# Patient Record
Sex: Male | Born: 1980 | Race: Black or African American | Hispanic: No | Marital: Single | Smoking: Never smoker
Health system: Southern US, Community
[De-identification: ages and names within clinical notes are randomized; demographics above are authoritative.]

## PROBLEM LIST (undated history)

## (undated) DIAGNOSIS — E119 Type 2 diabetes mellitus without complications: Secondary | ICD-10-CM

---

## 2019-07-24 DIAGNOSIS — Z419 Encounter for procedure for purposes other than remedying health state, unspecified: Secondary | ICD-10-CM | POA: Diagnosis not present

## 2019-08-24 DIAGNOSIS — Z419 Encounter for procedure for purposes other than remedying health state, unspecified: Secondary | ICD-10-CM | POA: Diagnosis not present

## 2019-09-24 DIAGNOSIS — Z419 Encounter for procedure for purposes other than remedying health state, unspecified: Secondary | ICD-10-CM | POA: Diagnosis not present

## 2019-10-02 DIAGNOSIS — E1065 Type 1 diabetes mellitus with hyperglycemia: Secondary | ICD-10-CM | POA: Diagnosis not present

## 2019-10-24 DIAGNOSIS — Z419 Encounter for procedure for purposes other than remedying health state, unspecified: Secondary | ICD-10-CM | POA: Diagnosis not present

## 2019-11-24 DIAGNOSIS — Z419 Encounter for procedure for purposes other than remedying health state, unspecified: Secondary | ICD-10-CM | POA: Diagnosis not present

## 2019-12-24 DIAGNOSIS — Z419 Encounter for procedure for purposes other than remedying health state, unspecified: Secondary | ICD-10-CM | POA: Diagnosis not present

## 2020-01-24 DIAGNOSIS — Z419 Encounter for procedure for purposes other than remedying health state, unspecified: Secondary | ICD-10-CM | POA: Diagnosis not present

## 2020-02-16 DIAGNOSIS — K3184 Gastroparesis: Secondary | ICD-10-CM | POA: Diagnosis present

## 2020-02-24 DIAGNOSIS — Z419 Encounter for procedure for purposes other than remedying health state, unspecified: Secondary | ICD-10-CM | POA: Diagnosis not present

## 2020-03-12 DIAGNOSIS — E11649 Type 2 diabetes mellitus with hypoglycemia without coma: Secondary | ICD-10-CM | POA: Diagnosis not present

## 2020-03-23 DIAGNOSIS — Z419 Encounter for procedure for purposes other than remedying health state, unspecified: Secondary | ICD-10-CM | POA: Diagnosis not present

## 2020-04-15 DIAGNOSIS — E11649 Type 2 diabetes mellitus with hypoglycemia without coma: Secondary | ICD-10-CM | POA: Diagnosis not present

## 2020-04-15 DIAGNOSIS — D631 Anemia in chronic kidney disease: Secondary | ICD-10-CM | POA: Diagnosis not present

## 2020-04-15 DIAGNOSIS — N189 Chronic kidney disease, unspecified: Secondary | ICD-10-CM | POA: Diagnosis not present

## 2020-04-15 DIAGNOSIS — E1122 Type 2 diabetes mellitus with diabetic chronic kidney disease: Secondary | ICD-10-CM | POA: Diagnosis not present

## 2020-04-23 DIAGNOSIS — Z419 Encounter for procedure for purposes other than remedying health state, unspecified: Secondary | ICD-10-CM | POA: Diagnosis not present

## 2020-05-23 DIAGNOSIS — Z419 Encounter for procedure for purposes other than remedying health state, unspecified: Secondary | ICD-10-CM | POA: Diagnosis not present

## 2020-05-27 HISTORY — PX: COCHLEAR IMPLANT: SHX184

## 2020-06-23 DIAGNOSIS — Z419 Encounter for procedure for purposes other than remedying health state, unspecified: Secondary | ICD-10-CM | POA: Diagnosis not present

## 2020-07-23 DIAGNOSIS — Z419 Encounter for procedure for purposes other than remedying health state, unspecified: Secondary | ICD-10-CM | POA: Diagnosis not present

## 2020-08-23 DIAGNOSIS — Z419 Encounter for procedure for purposes other than remedying health state, unspecified: Secondary | ICD-10-CM | POA: Diagnosis not present

## 2020-09-10 DIAGNOSIS — Z6820 Body mass index (BMI) 20.0-20.9, adult: Secondary | ICD-10-CM | POA: Diagnosis not present

## 2020-09-10 DIAGNOSIS — H903 Sensorineural hearing loss, bilateral: Secondary | ICD-10-CM | POA: Diagnosis not present

## 2020-09-23 DIAGNOSIS — Z419 Encounter for procedure for purposes other than remedying health state, unspecified: Secondary | ICD-10-CM | POA: Diagnosis not present

## 2020-09-23 DIAGNOSIS — H903 Sensorineural hearing loss, bilateral: Secondary | ICD-10-CM | POA: Diagnosis not present

## 2020-09-23 DIAGNOSIS — Z9621 Cochlear implant status: Secondary | ICD-10-CM | POA: Diagnosis not present

## 2020-10-21 DIAGNOSIS — R6889 Other general symptoms and signs: Secondary | ICD-10-CM | POA: Diagnosis not present

## 2020-10-21 DIAGNOSIS — R404 Transient alteration of awareness: Secondary | ICD-10-CM | POA: Diagnosis not present

## 2020-10-21 DIAGNOSIS — R739 Hyperglycemia, unspecified: Secondary | ICD-10-CM | POA: Diagnosis not present

## 2020-10-21 DIAGNOSIS — E161 Other hypoglycemia: Secondary | ICD-10-CM | POA: Diagnosis not present

## 2020-10-21 DIAGNOSIS — Z743 Need for continuous supervision: Secondary | ICD-10-CM | POA: Diagnosis not present

## 2020-10-23 DIAGNOSIS — Z419 Encounter for procedure for purposes other than remedying health state, unspecified: Secondary | ICD-10-CM | POA: Diagnosis not present

## 2020-10-25 ENCOUNTER — Other Ambulatory Visit: Payer: Self-pay

## 2020-10-25 ENCOUNTER — Observation Stay
Admission: EM | Admit: 2020-10-25 | Discharge: 2020-10-27 | Disposition: A | Discharge status: Home / Self Care | Payer: Medicaid Other | Attending: Internal Medicine | Admitting: Internal Medicine

## 2020-10-25 DIAGNOSIS — N179 Acute kidney failure, unspecified: Secondary | ICD-10-CM | POA: Diagnosis present

## 2020-10-25 DIAGNOSIS — Z20822 Contact with and (suspected) exposure to covid-19: Secondary | ICD-10-CM | POA: Diagnosis not present

## 2020-10-25 DIAGNOSIS — R112 Nausea with vomiting, unspecified: Principal | ICD-10-CM | POA: Insufficient documentation

## 2020-10-25 DIAGNOSIS — R197 Diarrhea, unspecified: Secondary | ICD-10-CM | POA: Diagnosis not present

## 2020-10-25 DIAGNOSIS — E1043 Type 1 diabetes mellitus with diabetic autonomic (poly)neuropathy: Secondary | ICD-10-CM | POA: Insufficient documentation

## 2020-10-25 DIAGNOSIS — I10 Essential (primary) hypertension: Secondary | ICD-10-CM | POA: Diagnosis present

## 2020-10-25 DIAGNOSIS — R1111 Vomiting without nausea: Secondary | ICD-10-CM | POA: Diagnosis not present

## 2020-10-25 DIAGNOSIS — D638 Anemia in other chronic diseases classified elsewhere: Secondary | ICD-10-CM | POA: Diagnosis present

## 2020-10-25 DIAGNOSIS — R110 Nausea: Secondary | ICD-10-CM | POA: Diagnosis not present

## 2020-10-25 DIAGNOSIS — Z794 Long term (current) use of insulin: Secondary | ICD-10-CM | POA: Diagnosis not present

## 2020-10-25 DIAGNOSIS — E10319 Type 1 diabetes mellitus with unspecified diabetic retinopathy without macular edema: Secondary | ICD-10-CM | POA: Diagnosis present

## 2020-10-25 DIAGNOSIS — K3184 Gastroparesis: Secondary | ICD-10-CM | POA: Diagnosis present

## 2020-10-25 DIAGNOSIS — N189 Chronic kidney disease, unspecified: Secondary | ICD-10-CM | POA: Diagnosis present

## 2020-10-25 DIAGNOSIS — I959 Hypotension, unspecified: Secondary | ICD-10-CM | POA: Diagnosis not present

## 2020-10-25 HISTORY — DX: Type 2 diabetes mellitus without complications: E11.9

## 2020-10-25 LAB — COMPREHENSIVE METABOLIC PANEL
ALT: 22 U/L (ref 0–44)
AST: 19 U/L (ref 15–41)
Albumin: 4 g/dL (ref 3.5–5.0)
Alkaline Phosphatase: 70 U/L (ref 38–126)
Anion gap: 9 (ref 5–15)
BUN: 58 mg/dL — ABNORMAL HIGH (ref 6–20)
CO2: 28 mmol/L (ref 22–32)
Calcium: 9.3 mg/dL (ref 8.9–10.3)
Chloride: 104 mmol/L (ref 98–111)
Creatinine, Ser: 2.32 mg/dL — ABNORMAL HIGH (ref 0.61–1.24)
GFR, Estimated: 35 mL/min — ABNORMAL LOW (ref >60)
Glucose, Bld: 91 mg/dL (ref 70–99)
Potassium: 4.3 mmol/L (ref 3.5–5.1)
Sodium: 141 mmol/L (ref 135–145)
Total Bilirubin: 0.6 mg/dL (ref 0.3–1.2)
Total Protein: 7.7 g/dL (ref 6.5–8.1)

## 2020-10-25 LAB — CBC
HCT: 37.1 % — ABNORMAL LOW (ref 39.0–52.0)
Hemoglobin: 12.4 g/dL — ABNORMAL LOW (ref 13.0–17.0)
MCH: 27.3 pg (ref 26.0–34.0)
MCHC: 33.4 g/dL (ref 30.0–36.0)
MCV: 81.5 fL (ref 80.0–100.0)
Platelets: 206 10*3/uL (ref 150–400)
RBC: 4.55 MIL/uL (ref 4.22–5.81)
RDW: 14 % (ref 11.5–15.5)
WBC: 10.2 10*3/uL (ref 4.0–10.5)
nRBC: 0 % (ref 0.0–0.2)

## 2020-10-25 LAB — URINALYSIS, COMPLETE (UACMP) WITH MICROSCOPIC
Bacteria, UA: NONE SEEN
Bilirubin Urine: NEGATIVE
Glucose, UA: >=500 mg/dL — AB
Hgb urine dipstick: NEGATIVE
Ketones, ur: 5 mg/dL — AB
Leukocytes,Ua: NEGATIVE
Nitrite: NEGATIVE
Protein, ur: 30 mg/dL — AB
Specific Gravity, Urine: 1.022 (ref 1.005–1.030)
pH: 5 (ref 5.0–8.0)

## 2020-10-25 LAB — CBG MONITORING, ED: Glucose-Capillary: 95 mg/dL (ref 70–99)

## 2020-10-25 LAB — LIPASE, BLOOD: Lipase: 24 U/L (ref 11–51)

## 2020-10-25 MED ORDER — ONDANSETRON HCL 4 MG/2ML IJ SOLN
4.0000 mg | Freq: Once | INTRAMUSCULAR | Status: AC | PRN
  Administered 2020-10-25: 4 mg via INTRAVENOUS
  Filled 2020-10-25: qty 2

## 2020-10-25 NOTE — ED Notes (Signed)
Hearing impaired

## 2020-10-25 NOTE — ED Triage Notes (Addendum)
Pt presents to ER c/o nausea all day and emesis.  Pt states he has had countless episodes of vomiting today.  Pt states he might be in DKA d/t his symptoms.  Pt's CBG in triage 95.  Pt A&O x4.  Pt denies abd pain but states he has had some loose stools.  Pt denies sick exposure.  Pt has hx of diabetes and takes insulin at home and has been taking it like normal.

## 2020-10-26 ENCOUNTER — Encounter: Payer: Self-pay | Admitting: Internal Medicine

## 2020-10-26 DIAGNOSIS — I10 Essential (primary) hypertension: Secondary | ICD-10-CM | POA: Diagnosis present

## 2020-10-26 DIAGNOSIS — R112 Nausea with vomiting, unspecified: Secondary | ICD-10-CM

## 2020-10-26 DIAGNOSIS — N179 Acute kidney failure, unspecified: Secondary | ICD-10-CM | POA: Diagnosis present

## 2020-10-26 DIAGNOSIS — K3184 Gastroparesis: Secondary | ICD-10-CM

## 2020-10-26 DIAGNOSIS — D638 Anemia in other chronic diseases classified elsewhere: Secondary | ICD-10-CM | POA: Diagnosis present

## 2020-10-26 DIAGNOSIS — E10319 Type 1 diabetes mellitus with unspecified diabetic retinopathy without macular edema: Secondary | ICD-10-CM | POA: Diagnosis present

## 2020-10-26 LAB — RESP PANEL BY RT-PCR (FLU A&B, COVID) ARPGX2
Influenza A by PCR: NEGATIVE
Influenza B by PCR: NEGATIVE
SARS Coronavirus 2 by RT PCR: NEGATIVE

## 2020-10-26 LAB — CBG MONITORING, ED
Glucose-Capillary: 109 mg/dL — ABNORMAL HIGH (ref 70–99)
Glucose-Capillary: 255 mg/dL — ABNORMAL HIGH (ref 70–99)
Glucose-Capillary: 348 mg/dL — ABNORMAL HIGH (ref 70–99)
Glucose-Capillary: 69 mg/dL — ABNORMAL LOW (ref 70–99)

## 2020-10-26 LAB — BASIC METABOLIC PANEL
Anion gap: 7 (ref 5–15)
BUN: 49 mg/dL — ABNORMAL HIGH (ref 6–20)
CO2: 24 mmol/L (ref 22–32)
Calcium: 8.5 mg/dL — ABNORMAL LOW (ref 8.9–10.3)
Chloride: 107 mmol/L (ref 98–111)
Creatinine, Ser: 2.04 mg/dL — ABNORMAL HIGH (ref 0.61–1.24)
GFR, Estimated: 41 mL/min — ABNORMAL LOW (ref >60)
Glucose, Bld: 140 mg/dL — ABNORMAL HIGH (ref 70–99)
Potassium: 4.4 mmol/L (ref 3.5–5.1)
Sodium: 138 mmol/L (ref 135–145)

## 2020-10-26 LAB — CBC
HCT: 33.1 % — ABNORMAL LOW (ref 39.0–52.0)
Hemoglobin: 11.1 g/dL — ABNORMAL LOW (ref 13.0–17.0)
MCH: 27.5 pg (ref 26.0–34.0)
MCHC: 33.5 g/dL (ref 30.0–36.0)
MCV: 82.1 fL (ref 80.0–100.0)
Platelets: 160 10*3/uL (ref 150–400)
RBC: 4.03 MIL/uL — ABNORMAL LOW (ref 4.22–5.81)
RDW: 13.9 % (ref 11.5–15.5)
WBC: 9.9 10*3/uL (ref 4.0–10.5)
nRBC: 0 % (ref 0.0–0.2)

## 2020-10-26 LAB — URINE DRUG SCREEN, QUALITATIVE (ARMC ONLY)
Amphetamines, Ur Screen: NOT DETECTED
Barbiturates, Ur Screen: NOT DETECTED
Benzodiazepine, Ur Scrn: NOT DETECTED
Cannabinoid 50 Ng, Ur ~~LOC~~: NOT DETECTED
Cocaine Metabolite,Ur ~~LOC~~: NOT DETECTED
MDMA (Ecstasy)Ur Screen: NOT DETECTED
Methadone Scn, Ur: NOT DETECTED
Opiate, Ur Screen: NOT DETECTED
Phencyclidine (PCP) Ur S: NOT DETECTED
Tricyclic, Ur Screen: NOT DETECTED

## 2020-10-26 LAB — GLUCOSE, CAPILLARY
Glucose-Capillary: 179 mg/dL — ABNORMAL HIGH (ref 70–99)
Glucose-Capillary: 157 mg/dL — ABNORMAL HIGH (ref 70–99)

## 2020-10-26 LAB — HIV ANTIBODY (ROUTINE TESTING W REFLEX): HIV Screen 4th Generation wRfx: NONREACTIVE

## 2020-10-26 MED ORDER — SODIUM CHLORIDE 0.9 % IV SOLN: INTRAVENOUS | Status: DC

## 2020-10-26 MED ORDER — HYDROCODONE-ACETAMINOPHEN 5-325 MG PO TABS
1.0000 | ORAL_TABLET | ORAL | Status: DC | PRN
Start: 2020-10-26 — End: 2020-10-27

## 2020-10-26 MED ORDER — ACETAMINOPHEN 650 MG RE SUPP: 650.0000 mg | Freq: Four times a day (QID) | RECTAL | Status: DC | PRN

## 2020-10-26 MED ORDER — ONDANSETRON HCL 4 MG/2ML IJ SOLN
4.0000 mg | Freq: Once | INTRAMUSCULAR | Status: AC
  Administered 2020-10-26: 4 mg via INTRAVENOUS
  Filled 2020-10-26: qty 2

## 2020-10-26 MED ORDER — INSULIN ASPART 100 UNIT/ML IJ SOLN: 0.0000 [IU] | Freq: Three times a day (TID) | INTRAMUSCULAR | Status: DC

## 2020-10-26 MED ORDER — ACETAMINOPHEN 325 MG PO TABS: 650.0000 mg | ORAL_TABLET | Freq: Four times a day (QID) | ORAL | Status: DC | PRN

## 2020-10-26 MED ORDER — INSULIN ASPART 100 UNIT/ML IJ SOLN: 0.0000 [IU] | Freq: Every day | INTRAMUSCULAR | Status: DC

## 2020-10-26 MED ORDER — ONDANSETRON HCL 4 MG/2ML IJ SOLN: 4.0000 mg | Freq: Four times a day (QID) | INTRAMUSCULAR | Status: DC | PRN

## 2020-10-26 MED ORDER — INSULIN GLARGINE-YFGN 100 UNIT/ML ~~LOC~~ SOLN
20.0000 [IU] | Freq: Every day | SUBCUTANEOUS | Status: DC
  Administered 2020-10-26: 20 [IU] via SUBCUTANEOUS
  Filled 2020-10-26 (×2): qty 0.2

## 2020-10-26 MED ORDER — INSULIN ASPART 100 UNIT/ML IJ SOLN
0.0000 [IU] | Freq: Three times a day (TID) | INTRAMUSCULAR | Status: DC
  Administered 2020-10-26: 8 [IU] via SUBCUTANEOUS
  Administered 2020-10-26: 3 [IU] via SUBCUTANEOUS
  Filled 2020-10-26 (×2): qty 1

## 2020-10-26 MED ORDER — LACTATED RINGERS IV SOLN: INTRAVENOUS | Status: DC

## 2020-10-26 MED ORDER — ENOXAPARIN SODIUM 40 MG/0.4ML IJ SOSY
40.0000 mg | PREFILLED_SYRINGE | INTRAMUSCULAR | Status: DC
  Administered 2020-10-26 – 2020-10-27 (×2): 40 mg via SUBCUTANEOUS
  Filled 2020-10-26 (×2): qty 0.4

## 2020-10-26 MED ORDER — INSULIN ASPART 100 UNIT/ML IJ SOLN
3.0000 [IU] | Freq: Three times a day (TID) | INTRAMUSCULAR | Status: DC
  Administered 2020-10-26: 3 [IU] via SUBCUTANEOUS
  Filled 2020-10-26: qty 1

## 2020-10-26 MED ORDER — ONDANSETRON HCL 4 MG PO TABS: 4.0000 mg | ORAL_TABLET | Freq: Four times a day (QID) | ORAL | Status: DC | PRN

## 2020-10-26 MED ORDER — MORPHINE SULFATE (PF) 2 MG/ML IV SOLN: 2.0000 mg | INTRAVENOUS | Status: DC | PRN

## 2020-10-26 MED ORDER — SODIUM CHLORIDE 0.9 % IV BOLUS (SEPSIS)
1000.0000 mL | Freq: Once | INTRAVENOUS | Status: AC
  Administered 2020-10-26: 1000 mL via INTRAVENOUS

## 2020-10-26 MED ORDER — SODIUM CHLORIDE 0.9 % IV SOLN: INTRAVENOUS | Status: AC

## 2020-10-26 MED ORDER — INSULIN ASPART 100 UNIT/ML IJ SOLN
0.0000 [IU] | Freq: Three times a day (TID) | INTRAMUSCULAR | Status: DC
Start: 2020-10-26 — End: 2020-10-26
  Administered 2020-10-26: 7 [IU] via SUBCUTANEOUS
  Filled 2020-10-26: qty 1

## 2020-10-26 NOTE — Hospital Course (Signed)
James [name] is a 39 y.o. 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  Patient was last hospitalized for DKA in June 2022.  He denies abdominal pain or diarrhea, fever or chills, cough shortness of breath or chest pain.   ED course: Tachycardic to 101 on arrival with otherwise normal vitals Blood work: Blood glucose 95 with normal anion gap Creatinine 2.32, up from 1 in June 2022 Lipase, liver enzymes and UA unremarkable Hemoglobin 12.4, normal WBC   Patient given a couple IV fluid boluses and started on maintenance normal saline as well as IV antiemetics.  Hospitalist consulted for admission.

## 2020-10-26 NOTE — Assessment & Plan Note (Addendum)
Baseline Cr 1.0.  Cr 2.3 on admission, UA with hyaline casts, no cellular sediment.  Started on fluids, down to 2.0 this morning. -Continue IV fluids -Hold ACEi

## 2020-10-26 NOTE — Assessment & Plan Note (Signed)
Uses basal rate on pump ~0.9 units/hr. Glucose this AM up to 340s  - Start Lantus 20 units daily - SS corrections

## 2020-10-26 NOTE — Assessment & Plan Note (Signed)
Hold lisinopril

## 2020-10-26 NOTE — H&P (Signed)
History and Physical    James [name] KVQ:[id number] DOB: [birth year] DOA: 10/25/2020  PCP: Pcp, No   Patient coming from: home  I have personally briefly reviewed patient's old medical records in [facility]  Chief Complaint: vomiting and weakness  HPI: James [name] is a 41 y.o. 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  Patient was last hospitalized for DKA in June 2022.  He denies abdominal pain or diarrhea, fever or chills, cough shortness of breath or chest pain.  ED course: Tachycardic to 101 on arrival with otherwise normal vitals Blood work: Blood glucose 95 with normal anion gap Creatinine 2.32, up from 1 in June 2022 Lipase, liver enzymes and UA unremarkable Hemoglobin 12.4, normal WBC  Patient given a couple IV fluid boluses and started on maintenance normal saline as well as IV antiemetics.  Hospitalist consulted for admission.  Review of Systems: As per HPI otherwise all other systems on review of systems negative.    Past Medical History:  Diagnosis Date   Diabetes mellitus without complication (HCC)     History reviewed. No pertinent surgical history.   reports that he has never smoked. He has never used smokeless tobacco. He reports that he does not currently use alcohol. He reports that he does not currently use drugs.  No Known Allergies  History reviewed. No pertinent family history.    Prior to Admission medications   Not on File    Physical Exam: Vitals:   10/25/20 2030 10/25/20 2031 10/26/20 0040  BP: 111/78  129/77  Pulse: (!) 101  (!) 102  Resp: 20  18  Temp: 98.9 F (37.2 C)    TempSrc: Oral    SpO2: 98%  99%  Weight:  72.6 kg   Height:  6\' 2"  (1.88 m)      Vitals:   10/25/20 2030 10/25/20 2031 10/26/20 0040  BP: 111/78  129/77  Pulse: (!) 101  (!) 102   Resp: 20  18  Temp: 98.9 F (37.2 C)    TempSrc: Oral    SpO2: 98%  99%  Weight:  72.6 kg   Height:  6\' 2"  (1.88 m)       Constitutional: Alert and oriented x 3 . Not in any apparent distress HEENT:      Head: Normocephalic and atraumatic.         Eyes: PERLA, EOMI, Conjunctivae are normal. Sclera is non-icteric.       Mouth/Throat: Mucous membranes are moist.       Neck: Supple with no signs of meningismus. Cardiovascular: Regular rate and rhythm. No murmurs, gallops, or rubs. 2+ symmetrical distal pulses are present . No JVD. No LE edema Respiratory: Respiratory effort normal .Lungs sounds clear bilaterally. No wheezes, crackles, or rhonchi.  Gastrointestinal: Soft, non tender, and non distended with positive bowel sounds.  Genitourinary: No CVA tenderness. Musculoskeletal: Nontender with normal range of motion in all extremities. No cyanosis, or erythema of extremities. Neurologic:  Face is symmetric. Moving all extremities. No gross focal neurologic deficits . Skin: Skin is warm, dry.  No rash or ulcers Psychiatric: Mood and affect are normal    Labs on Admission: I have personally reviewed following labs and imaging studies  CBC: Recent Labs  Lab 10/25/20 2035  WBC 10.2  HGB 12.4*  HCT 37.1*  MCV 81.5  PLT 206  Basic Metabolic Panel: Recent Labs  Lab 10/25/20 2035  NA 141  K 4.3  CL 104  CO2 28  GLUCOSE 91  BUN 58*  CREATININE 2.32*  CALCIUM 9.3   GFR: Estimated Creatinine Clearance: 43 mL/min (A) (by C-G formula based on SCr of 2.32 mg/dL (H)). Liver Function Tests: Recent Labs  Lab 10/25/20 2035  AST 19  ALT 22  ALKPHOS 70  BILITOT 0.6  PROT 7.7  ALBUMIN 4.0   Recent Labs  Lab 10/25/20 2035  LIPASE 24   No results for input(s): AMMONIA in the last 168 hours. Coagulation Profile: No results for input(s): INR, PROTIME in the last 168 hours. Cardiac Enzymes: No results for input(s): CKTOTAL, CKMB, CKMBINDEX, TROPONINI in the last 168  hours. BNP (last 3 results) No results for input(s): PROBNP in the last 8760 hours. HbA1C: No results for input(s): HGBA1C in the last 72 hours. CBG: Recent Labs  Lab 10/25/20 2026 10/26/20 0029 10/26/20 0116  GLUCAP 95 69* 109*   Lipid Profile: No results for input(s): CHOL, HDL, LDLCALC, TRIG, CHOLHDL, LDLDIRECT in the last 72 hours. Thyroid Function Tests: No results for input(s): TSH, T4TOTAL, FREET4, T3FREE, THYROIDAB in the last 72 hours. Anemia Panel: No results for input(s): VITAMINB12, FOLATE, FERRITIN, TIBC, IRON, RETICCTPCT in the last 72 hours. Urine analysis:    Component Value Date/Time   COLORURINE YELLOW (A) 10/25/2020 2035   APPEARANCEUR HAZY (A) 10/25/2020 2035   LABSPEC 1.022 10/25/2020 2035   PHURINE 5.0 10/25/2020 2035   GLUCOSEU >=500 (A) 10/25/2020 2035   HGBUR NEGATIVE 10/25/2020 2035   BILIRUBINUR NEGATIVE 10/25/2020 2035   KETONESUR 5 (A) 10/25/2020 2035   PROTEINUR 30 (A) 10/25/2020 2035   NITRITE NEGATIVE 10/25/2020 2035   LEUKOCYTESUR NEGATIVE 10/25/2020 2035    Radiological Exams on Admission: No results found.   Assessment/Plan 40 year old with type 1 diabetes complicated by gastroparesis and retinopathy, HTN, w presenting with intractable vomiting and generalized weakness.    Intractable nausea and vomiting Diabetic gastroparesis - Lipase and liver enzymes as well as UA unremarkable -IV antiemetics - IV fluids - Ice chips then clear liquids to advance as tolerated    AKI (acute kidney injury) (HCC) -Creatinine 2.32 up from baseline of 1.0 in June 2022 - IV hydration and monitor    Type 1 diabetes mellitus with other specified complication -Sliding scale insulin    HTN (hypertension) - Continue home lisinopril    DVT prophylaxis: Lovenox  Code Status: full code  Family Communication:  none  Disposition Plan: Back to previous home environment Consults called: none  Status: Observation    Andris Baumann MD [facility]     10/26/2020, 1:19 AM

## 2020-10-26 NOTE — ED Notes (Signed)
Ward, MD notified about pt CBG of 69. This RN gave pt oragne juice going to recheck in 15- 30 mins

## 2020-10-26 NOTE — ED Provider Notes (Signed)
[facility] Emergency Department Provider Note  ____________________________________________   Event Date/Time   First MD Initiated Contact with Patient 10/25/20 2341     (approximate)  I have reviewed the triage vital signs and the nursing notes.   HISTORY  Chief Complaint Emesis    HPI James [name] is a 40 y.o. male with history of insulin-dependent diabetes, gastroparesis who presents to the emergency department with multiple episodes of nonbloody, nonbilious vomiting today.  States he was concerned that he could be in DKA.  States his sugars were elevated but he was able to get them down but continued to vomit.  Also reports he has had some diarrhea as well.  Denies chest pain, abdominal pain, fever but has had chills.  No known sick contacts or recent travel.        Past Medical History:  Diagnosis Date   Diabetes mellitus without complication (HCC)     There are no problems to display for this patient.   History reviewed. No pertinent surgical history.  Prior to Admission medications   Not on File    Allergies Patient has no known allergies.  History reviewed. No pertinent family history.  Social History Social History   Tobacco Use   Smoking status: Never   Smokeless tobacco: Never  Substance Use Topics   Alcohol use: Not Currently   Drug use: Not Currently    Review of Systems Constitutional: No fever. Eyes: No visual changes. ENT: No sore throat. Cardiovascular: Denies chest pain. Respiratory: Denies shortness of breath. Gastrointestinal: + nausea, vomiting, diarrhea. Genitourinary: Negative for dysuria. Musculoskeletal: Negative for back pain. Skin: Negative for rash. Neurological: Negative for focal weakness or numbness.  ____________________________________________   PHYSICAL EXAM:  VITAL SIGNS: ED Triage Vitals  Enc Vitals Group     BP 10/25/20 2030 111/78     Pulse Rate 10/25/20 2030 (!) 101     Resp  10/25/20 2030 20     Temp 10/25/20 2030 98.9 F (37.2 C)     Temp Source 10/25/20 2030 Oral     SpO2 10/25/20 2030 98 %     Weight 10/25/20 2031 160 lb (72.6 kg)     Height 10/25/20 2031 6\' 2"  (1.88 m)     Head Circumference --      Peak Flow --      Pain Score 10/25/20 2031 0     Pain Loc --      Pain Edu? --      Excl. in GC? --    CONSTITUTIONAL: Alert and oriented and responds appropriately to questions. Well-appearing; well-nourished HEAD: Normocephalic EYES: Conjunctivae clear, pupils appear equal, EOM appear intact ENT: normal nose; slightly dry mucous membranes NECK: Supple, normal ROM CARD: RRR; S1 and S2 appreciated; no murmurs, no clicks, no rubs, no gallops RESP: Normal chest excursion without splinting or tachypnea; breath sounds clear and equal bilaterally; no wheezes, no rhonchi, no rales, no hypoxia or respiratory distress, speaking full sentences ABD/GI: Normal bowel sounds; non-distended; soft, non-tender, no rebound, no guarding, no peritoneal signs, no hepatosplenomegaly BACK: The back appears normal EXT: Normal ROM in all joints; no deformity noted, no edema; no cyanosis SKIN: Normal color for age and race; warm; no rash on exposed skin NEURO: Moves all extremities equally PSYCH: The patient's mood and manner are appropriate.  ____________________________________________   LABS (all labs ordered are listed, but only abnormal results are displayed)  Labs Reviewed  COMPREHENSIVE METABOLIC PANEL - Abnormal; Notable for the  following components:      Result Value   BUN 58 (*)    Creatinine, Ser 2.32 (*)    GFR, Estimated 35 (*)    All other components within normal limits  CBC - Abnormal; Notable for the following components:   Hemoglobin 12.4 (*)    HCT 37.1 (*)    All other components within normal limits  URINALYSIS, COMPLETE (UACMP) WITH MICROSCOPIC - Abnormal; Notable for the following components:   Color, Urine YELLOW (*)    APPearance HAZY (*)     Glucose, UA >=500 (*)    Ketones, ur 5 (*)    Protein, ur 30 (*)    All other components within normal limits  CBG MONITORING, ED - Abnormal; Notable for the following components:   Glucose-Capillary 69 (*)    All other components within normal limits  RESP PANEL BY RT-PCR (FLU A&B, COVID) ARPGX2  LIPASE, BLOOD  URINE DRUG SCREEN, QUALITATIVE ([facility] ONLY)  CBG MONITORING, ED   ____________________________________________  EKG   ____________________________________________  RADIOLOGY I, Tayte Childers, personally viewed and evaluated these images (plain radiographs) as part of my medical decision making, as well as reviewing the written report by the radiologist.  ED MD interpretation:    Official radiology report(s): No results found.  ____________________________________________   PROCEDURES  Procedure(s) performed (including Critical Care):  Procedures   ____________________________________________   INITIAL IMPRESSION / ASSESSMENT AND PLAN / ED COURSE  As part of my medical decision making, I reviewed the following data within the electronic MEDICAL RECORD NUMBER Nursing notes reviewed and incorporated, Labs reviewed , Old chart reviewed, Discussed with admitting physician , and Notes from prior ED visits         Patient here with nausea and vomiting.  He was concerned that he was in DKA.  Blood sugars have improved and now slightly on the low side and labs are not suggestive of DKA today.  Does however have a new AKI with a creatinine of 2.32 and BUN of 58.  Last creatinine in care everywhere on 07/15/2020 was 1.0 with a normal BUN of 17.  Will give IV fluids, Zofran for symptomatic relief.  I feel he will need admission for AKI.  ED PROGRESS  1:12 AM Discussed patient's case with hospitalist, Dr. Para March.  I have recommended admission and patient (and family if present) agree with this plan. Admitting physician will place admission orders.   I reviewed all nursing  notes, vitals, pertinent previous records and reviewed/interpreted all EKGs, lab and urine results, imaging (as available).  ____________________________________________   FINAL CLINICAL IMPRESSION(S) / ED DIAGNOSES  Final diagnoses:  AKI (acute kidney injury) (HCC)  Nausea and vomiting in adult     ED Discharge Orders     None       *Please note:  James [name] was evaluated in Emergency Department on 10/26/2020 for the symptoms described in the history of present illness. He was evaluated in the context of the global COVID-19 pandemic, which necessitated consideration that the patient might be at risk for infection with the SARS-CoV-2 virus that causes COVID-19. Institutional protocols and algorithms that pertain to the evaluation of patients at risk for COVID-19 are in a state of rapid change based on information released by regulatory bodies including the CDC and federal and state organizations. These policies and algorithms were followed during the patient's care in the ED.  Some ED evaluations and interventions may be delayed as a result of limited staffing during  and the pandemic.*   Note:  This document was prepared using Dragon voice recognition software and may include unintentional dictation errors.    Benzion Mesta, Layla Maw, DO 10/26/20 [phone]

## 2020-10-26 NOTE — Assessment & Plan Note (Signed)
-   Resume iron when taking PO at discharge

## 2020-10-26 NOTE — Progress Notes (Signed)
  Progress Note    James [name]   YHC:[id number]  DOB: [birth year]  DOA: 10/25/2020     0 Date of Service: 10/26/2020    Brief summary: James [name] is a 40 y.o. 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  Patient was last hospitalized for DKA in June 2022.  He denies abdominal pain or diarrhea, fever or chills, cough shortness of breath or chest pain.   ED course: Tachycardic to 101 on arrival with otherwise normal vitals Blood work: Blood glucose 95 with normal anion gap Creatinine 2.32, up from 1 in June 2022 Lipase, liver enzymes and UA unremarkable Hemoglobin 12.4, normal WBC   Patient given a couple IV fluid boluses and started on maintenance normal saline as well as IV antiemetics.  Hospitalist consulted for admission.      Subjective:  Seen and evaluated, sleeping, comfortable  Hospital Problems * Gastroparesis - IV fluids - Clears as tolerated - Aggressive antiemetics  AKI (acute kidney injury) (HCC) Baseline Cr 1.0.  Cr 2.3 on admission, UA with hyaline casts, no cellular sediment.  Started on fluids, down to 2.0 this morning. 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 Glucose this AM up to 340s  - Start Lantus 20 units daily - SS corrections  HTN (hypertension)  -Hold lisinopril  Anemia in other chronic diseases classified elsewhere - Resume iron when taking PO at discharge     Objective Vital signs were reviewed and unremarkable.  Vitals:   10/26/20 0400 10/26/20 0500 10/26/20 0530 10/26/20 0700  BP: (!) 153/91 (!) 152/95 (!) 141/80 (!) 168/106  Pulse:  86  93  Resp: 20 13 13  (!) 21  Temp:  98.6 F (37 C)    TempSrc:  Oral    SpO2:  99% 100% 98%  Weight:      Height:       72.6  kg  Exam Seen, appears comfortable.  This is a no charge note, please see docuemntaition from earlier today by Dr. / Other Information My review of labs, imaging, notes and other tests is significant for slightly better Cr.  Hgb stable to baseline     Time spent: 15 mintues [facility] 10/26/2020, 8:42 AM

## 2020-10-26 NOTE — Progress Notes (Signed)
Inpatient Diabetes Program Recommendations  AACE/ADA: New Consensus Statement on Inpatient Glycemic Control   Target Ranges:  Prepandial:   less than 140 mg/dL      Peak postprandial:   less than 180 mg/dL (1-2 hours)      Critically ill patients:  140 - 180 mg/dL   Results for James [name], DHONDT (MRN [id number]) as of 10/26/2020 12:05  Ref. Range 10/25/2020 20:26 10/26/2020 00:29 10/26/2020 01:16 10/26/2020 08:38 10/26/2020 11:56  Glucose-Capillary Latest Ref Range: 70 - 99 mg/dL 95 69 (L) 371 (H) 696 (H) 255 (H)    Review of Glycemic Control  Diabetes history: DM1 (makes NO insulin) Outpatient Diabetes medications: T-Slim insulin pump with Novolog Current orders for Inpatient glycemic control: Semglee 20 units daily, Novolog 3 units TID with meals, Novolog 0-15 units TID with meals, Novolog 0-5 units QHS  Inpatient Diabetes Program Recommendations:    Insulin: Please consider decreasing Novolog correction to 0-9 units TID with meals (per pump settings 1 unit drops glucose 50 mg/dl).  NOTE: Per chart, patient seen Bernadene Bell, RPh on 08/26/2020 regarding DM management. Per office visit note the following should be insulin pump settings:  Diabetes Medications: Novolog via Tandem T-slim X2 insulin pump Basic settings per MD order: Basal:  12am 0.6750units/hr, 6am 0.9 u/h, 5pm 1.40u/h 930pm 0.950u/h  Total basal: 22.625 units/24 hours  ICR: 12am 1:15 (1 unit covers 15 grams of carbs), 6am-12am: 1:10g (1 unit covers 10 grams of carbs) ISF: 1:50 mg/dl (1 unit drops 50 mg/dl) Active Insulin Time: 5 hours Target BG: 110 mg/dL  Spoke with patient at bedside regarding DM and insulin pump. Patient has insulin pump on but on suspend currently. Patient has on Dexcom CGM and able to look at insulin pump to see what current CGM is. Patient verified that last pump changes were made by Bernadene Bell, RPh in August and he has not made any changes with settings since then. Patient pulled up current insulin pump  settings and verified that settings are exactly as noted above. Patient states he feels that his pump and Dexcom are working fine. Discussed that since he is receiving SQ insulin, he will need to continue to keep pump suspended. Discussed that he received Semglee 20 units at 9:16 am today which will work for about 24 hours. Stressed that insulin pump will need to remain in suspend or disconnected for now to prevent overlap of insulin administration with pump and SQ insulin. Patient states he no longer feels nauseous and he has tolerated liquid diet so diet to be advanced for lunch. Patient verbalized understanding and states that he will keep pump on suspend mode for now. Will continue to follow while inpatient.  Thanks, Orlando Penner, RN, MSN, CDE Diabetes Coordinator Inpatient Diabetes Program [phone] (Team Pager from 8am to 5pm)

## 2020-10-26 NOTE — Assessment & Plan Note (Signed)
-   IV fluids - Clears as tolerated - Aggressive antiemetics

## 2020-10-27 DIAGNOSIS — K3184 Gastroparesis: Secondary | ICD-10-CM | POA: Diagnosis not present

## 2020-10-27 LAB — BASIC METABOLIC PANEL
Anion gap: 7 (ref 5–15)
BUN: 27 mg/dL — ABNORMAL HIGH (ref 6–20)
CO2: 29 mmol/L (ref 22–32)
Calcium: 9.1 mg/dL (ref 8.9–10.3)
Chloride: 101 mmol/L (ref 98–111)
Creatinine, Ser: 1.43 mg/dL — ABNORMAL HIGH (ref 0.61–1.24)
GFR, Estimated: >60 mL/min (ref >60)
Glucose, Bld: 244 mg/dL — ABNORMAL HIGH (ref 70–99)
Potassium: 4.6 mmol/L (ref 3.5–5.1)
Sodium: 137 mmol/L (ref 135–145)

## 2020-10-27 LAB — HEMOGLOBIN A1C
Hgb A1c MFr Bld: 8.3 % — ABNORMAL HIGH (ref 4.8–5.6)
Mean Plasma Glucose: 192 mg/dL

## 2020-10-27 LAB — GLUCOSE, CAPILLARY: Glucose-Capillary: 344 mg/dL — ABNORMAL HIGH (ref 70–99)

## 2020-10-27 MED ORDER — FERROUS SULFATE 325 (65 FE) MG PO TABS: 325.0000 mg | ORAL_TABLET | Freq: Every day | ORAL | Status: DC

## 2020-10-27 MED ORDER — PANTOPRAZOLE SODIUM 40 MG PO TBEC: 40.0000 mg | DELAYED_RELEASE_TABLET | Freq: Every day | ORAL | Status: DC

## 2020-10-27 NOTE — Discharge Instructions (Signed)
Patient advised to resume his insulin pump and manage according to his sugars at home. He is also advised to resume his blood pressure medicines that he was prescribed by his primary care physician. He should reach out to his primary care physician in [facility] if he has any questions about it.  Advised to keep log of sugars at home for PCP to review.

## 2020-10-27 NOTE — Discharge Summary (Signed)
[facility] [place] [facility]   PATIENT NAME: James [name]    MR#:  [id number]  DATE OF BIRTH:  02-10-1980  DATE OF ADMISSION:  10/25/2020 ADMITTING PHYSICIAN: Andris Baumann, MD  DATE OF DISCHARGE: 10/27/2020  PRIMARY CARE PHYSICIAN: Pcp, No    ADMISSION DIAGNOSIS:  AKI (acute kidney injury) (HCC) [N17.9] Nausea and vomiting in adult [R11.2]  DISCHARGE DIAGNOSIS:  Intractable nausea vomiting suspected due to diabetic gastroparesis acute renal failure improving   SECONDARY DIAGNOSIS:   Past Medical History:  Diagnosis Date   Diabetes mellitus without complication (HCC)     HOSPITAL COURSE:  40 year old with type 1 diabetes complicated by gastroparesis and retinopathy, HTN, w presenting with intractable vomiting and generalized weakness.     Intractable nausea and vomiting Diabetic gastroparesis - Lipase and liver enzymes as well as UA unremarkable -IV antiemetics prn - Received IV fluids - pt tolerating po carb control diet     AKI (acute kidney injury) (HCC) -Creatinine 2.32 up from baseline of 1.0 in June 2022 - received IV hydration and creat down to 1.4     Type 1 diabetes mellitus with other specified complication -Sliding scale insulin -- patient advised to resume his insulin pump. He'll manage it according to sugars at home. He'll follow-up with his PCP in [facility] ([place]).     HTN (hypertension) - Continue home lisinopril dose after d/c     DVT prophylaxis: Lovenox  Code Status: full code  Family Communication:  none  Disposition Plan: Back to previous home environment today. Pt agreeable Consults called: none  Status: Observation   overall hemodynamically stable. Will discharged to home. Patient in agreement with the plan.  CONSULTS OBTAINED:    DRUG ALLERGIES:  No Known Allergies  DISCHARGE MEDICATIONS:   Allergies as of 10/27/2020   No Known Allergies      Medication List     TAKE these  medications    ferrous sulfate 325 (65 FE) MG tablet Take 325 mg by mouth daily with breakfast.   insulin aspart 100 UNIT/ML injection Commonly known as: novoLOG Inject 3-8 Units into the skin See admin instructions. Inject 8u under the skin three times daily before meals and inject 3u under the skin before snacks   pantoprazole 40 MG tablet Commonly known as: PROTONIX Take 40 mg by mouth daily.        If you experience worsening of your admission symptoms, develop shortness of breath, life threatening emergency, suicidal or homicidal thoughts you must seek medical attention immediately by calling 911 or calling your MD immediately  if symptoms less severe.  You Must read complete instructions/literature along with all the possible adverse reactions/side effects for all the Medicines you take and that have been prescribed to you. Take any new Medicines after you have completely understood and accept all the possible adverse reactions/side effects.   Please note  You were cared for by a hospitalist during your hospital stay. If you have any questions about your discharge medications or the care you received while you were in the hospital after you are discharged, you can call the unit and asked to speak with the hospitalist on call if the hospitalist that took care of you is not available. Once you are discharged, your primary care physician will handle any further medical issues. Please note that NO REFILLS for any discharge medications will be authorized once you are discharged, as it is imperative that you return to your primary care  physician (or establish a relationship with a primary care physician if you do not have one) for your aftercare needs so that they can reassess your need for medications and monitor your lab values. Today   SUBJECTIVE   tolerating PO diet. No new complaints. No vomiting.  VITAL SIGNS:  Blood pressure (!) 149/100, pulse 84, temperature 97.6 F (36.4 C),  temperature source Oral, resp. rate 16, height 6\' 2"  (1.88 m), weight 72.6 kg, SpO2 100 %.  I/O:   Intake/Output Summary (Last 24 hours) at 10/27/2020 0807 Last data filed at 10/27/2020 0524 Gross per 24 hour  Intake 969.15 ml  Output 4350 ml  Net -3380.85 ml    PHYSICAL EXAMINATION:  GENERAL:  40 y.o.-year-old patient lying in the bed with no acute distress.  LUNGS: Normal breath sounds bilaterally, no wheezing, rales,rhonchi or crepitation. No use of accessory muscles of respiration.  CARDIOVASCULAR: S1, S2 normal. No murmurs, rubs, or gallops.  ABDOMEN: Soft, non-tender, non-distended. Bowel sounds present. No organomegaly or mass.  EXTREMITIES: No pedal edema, cyanosis, or clubbing.  NEUROLOGIC: grossly nonfocal PSYCHIATRIC: The patient is alert and oriented x 3.  SKIN: No obvious rash, lesion, or ulcer.   DATA REVIEW:   CBC  Recent Labs  Lab 10/26/20 0330  WBC 9.9  HGB 11.1*  HCT 33.1*  PLT 160    Chemistries  Recent Labs  Lab 10/25/20 2035 10/26/20 0330 10/27/20 0332  NA 141   < > 137  K 4.3   < > 4.6  CL 104   < > 101  CO2 28   < > 29  GLUCOSE 91   < > 244*  BUN 58*   < > 27*  CREATININE 2.32*   < > 1.43*  CALCIUM 9.3   < > 9.1  AST 19  --   --   ALT 22  --   --   ALKPHOS 70  --   --   BILITOT 0.6  --   --    < > = values in this interval not displayed.    Microbiology Results   Recent Results (from the past 240 hour(s))  Resp Panel by RT-PCR (Flu A&B, Covid) Nasopharyngeal Swab     Status: None   Collection Time: 10/26/20 12:32 AM   Specimen: Nasopharyngeal Swab; Nasopharyngeal(NP) swabs in vial transport medium  Result Value Ref Range Status   SARS Coronavirus 2 by RT PCR NEGATIVE NEGATIVE Final    Comment: (NOTE) SARS-CoV-2 target nucleic acids are NOT DETECTED.  The SARS-CoV-2 RNA is generally detectable in upper respiratory specimens during the acute phase of infection. The lowest concentration of SARS-CoV-2 viral copies this assay can  detect is 138 copies/mL. A negative result does not preclude SARS-Cov-2 infection and should not be used as the sole basis for treatment or other patient management decisions. A negative result may occur with  improper specimen collection/handling, submission of specimen other than nasopharyngeal swab, presence of viral mutation(s) within the areas targeted by this assay, and inadequate number of viral copies(<138 copies/mL). A negative result must be combined with clinical observations, patient history, and epidemiological information. The expected result is Negative.  Fact Sheet for Patients:  12/26/20  Fact Sheet for Healthcare Providers:  BloggerCourse.com  This test is no t yet approved or cleared by the [place] FDA and  has been authorized for detection and/or diagnosis of SARS-CoV-2 by FDA under an Emergency Use Authorization (EUA). This EUA will remain  in effect (meaning this  test can be used) for the duration of the COVID-19 declaration under Section 564(b)(1) of the Act, 21 U.S.C.section 360bbb-3(b)(1), unless the authorization is terminated  or revoked sooner.       Influenza A by PCR NEGATIVE NEGATIVE Final   Influenza B by PCR NEGATIVE NEGATIVE Final    Comment: (NOTE) The Xpert Xpress SARS-CoV-2/FLU/RSV plus assay is intended as an aid in the diagnosis of influenza from Nasopharyngeal swab specimens and should not be used as a sole basis for treatment. Nasal washings and aspirates are unacceptable for Xpert Xpress SARS-CoV-2/FLU/RSV testing.  Fact Sheet for Patients: BloggerCourse.com  Fact Sheet for Healthcare Providers: SeriousBroker.it  This test is not yet approved or cleared by the [place] FDA and has been authorized for detection and/or diagnosis of SARS-CoV-2 by FDA under an Emergency Use Authorization (EUA). This EUA will remain in  effect (meaning this test can be used) for the duration of the COVID-19 declaration under Section 564(b)(1) of the Act, 21 U.S.C. section 360bbb-3(b)(1), unless the authorization is terminated or revoked.  Performed at [facility], [address] [place], [place] [address]     RADIOLOGY:  No results found.   CODE STATUS:     Code Status Orders  (From admission, onward)           Start     Ordered   10/26/20 0124  Full code  Continuous        10/26/20 0124           Code Status History     This patient has a current code status but no historical code status.        TOTAL TIME TAKING CARE OF THIS PATIENT: 35 minutes.    Enedina Finner M.D  [facility]    CC: Primary care physician; Pcp, No

## 2020-10-27 NOTE — Progress Notes (Signed)
Patient is adequate for discharge per Dr. Allena Katz. Patient has resumed using his insulin pump and agreeable to following up with his primary provider as needed. No questions/concerns r/t discharge at this time. IV removed, medication reviewed.

## 2020-10-27 NOTE — Plan of Care (Signed)
Patient sleeping between care. No reports of pain. Denies nausea/vomiting. Plan of care reviewed with patient. Call bell within reach.  PLAN OF CARE ONGOING Problem: Education: Goal: Knowledge of General Education information will improve Description: Including pain rating scale, medication(s)/side effects and non-pharmacologic comfort measures Outcome: Progressing   Problem: Health Behavior/Discharge Planning: Goal: Ability to manage health-related needs will improve Outcome: Progressing   Problem: Clinical Measurements: Goal: Ability to maintain clinical measurements within normal limits will improve Outcome: Progressing Goal: Will remain free from infection Outcome: Progressing Goal: Diagnostic test results will improve Outcome: Progressing Goal: Respiratory complications will improve Outcome: Progressing Goal: Cardiovascular complication will be avoided Outcome: Progressing   Problem: Activity: Goal: Risk for activity intolerance will decrease Outcome: Progressing   Problem: Nutrition: Goal: Adequate nutrition will be maintained Outcome: Progressing   Problem: Coping: Goal: Level of anxiety will decrease Outcome: Progressing   Problem: Elimination: Goal: Will not experience complications related to bowel motility Outcome: Progressing Goal: Will not experience complications related to urinary retention Outcome: Progressing   Problem: Pain Managment: Goal: General experience of comfort will improve Outcome: Progressing   Problem: Safety: Goal: Ability to remain free from injury will improve Outcome: Progressing   Problem: Skin Integrity: Goal: Risk for impaired skin integrity will decrease Outcome: Progressing

## 2020-10-27 NOTE — Progress Notes (Signed)
Inpatient Diabetes Program Recommendations  AACE/ADA: New Consensus Statement on Inpatient Glycemic Control (2015)  Target Ranges:  Prepandial:   less than 140 mg/dL      Peak postprandial:   less than 180 mg/dL (1-2 hours)      Critically ill patients:  140 - 180 mg/dL     Review of Glycemic Control  Diabetes history: DM1 (makes NO insulin) Outpatient Diabetes medications: T-Slim insulin pump with Novolog Current orders for Inpatient glycemic control: Semglee 20 units daily, Novolog 3 units TID with meals, Novolog 0-15 units TID with meals, Novolog 0-5 units QHS   Inpatient Diabetes Program Recommendations:     Insulin: Patient received Semglee 20 units on 10/26/20 at 9:16 am. If patient is discharging home today recommend to discontinue Semglee and patient can resume his insulin pump at discharge today.    NOTE: Per chart, patient seen Bernadene Bell, RPh on 08/26/2020 regarding DM management. Per office visit note the following should be insulin pump settings:   Diabetes Medications: Novolog via Tandem T-slim X2 insulin pump Basic settings per MD order: Basal:  12am 0.6750units/hr, 6am 0.9 u/h, 5pm 1.40u/h 930pm 0.950u/h  Total basal: 22.625 units/24 hours  ICR: 12am 1:15 (1 unit covers 15 grams of carbs), 6am-12am: 1:10g (1 unit covers 10 grams of carbs) ISF: 1:50 mg/dl (1 unit drops 50 mg/dl) Active Insulin Time: 5 hours Target BG: 110 mg/dL  Thanks, Orlando Penner, RN, MSN, CDE Diabetes Coordinator Inpatient Diabetes Program [phone] (Team Pager from 8am to 5pm)

## 2020-11-23 DIAGNOSIS — Z419 Encounter for procedure for purposes other than remedying health state, unspecified: Secondary | ICD-10-CM | POA: Diagnosis not present

## 2020-12-13 DIAGNOSIS — E10649 Type 1 diabetes mellitus with hypoglycemia without coma: Secondary | ICD-10-CM | POA: Diagnosis not present

## 2020-12-13 DIAGNOSIS — E7800 Pure hypercholesterolemia, unspecified: Secondary | ICD-10-CM | POA: Diagnosis not present

## 2020-12-13 DIAGNOSIS — Z8679 Personal history of other diseases of the circulatory system: Secondary | ICD-10-CM | POA: Diagnosis not present

## 2020-12-13 DIAGNOSIS — E1065 Type 1 diabetes mellitus with hyperglycemia: Secondary | ICD-10-CM | POA: Diagnosis not present

## 2020-12-23 DIAGNOSIS — Z419 Encounter for procedure for purposes other than remedying health state, unspecified: Secondary | ICD-10-CM | POA: Diagnosis not present

## 2021-01-23 DIAGNOSIS — Z419 Encounter for procedure for purposes other than remedying health state, unspecified: Secondary | ICD-10-CM | POA: Diagnosis not present

## 2021-02-23 DIAGNOSIS — Z419 Encounter for procedure for purposes other than remedying health state, unspecified: Secondary | ICD-10-CM | POA: Diagnosis not present

## 2021-02-26 DIAGNOSIS — R404 Transient alteration of awareness: Secondary | ICD-10-CM | POA: Diagnosis not present

## 2021-02-26 DIAGNOSIS — Z743 Need for continuous supervision: Secondary | ICD-10-CM | POA: Diagnosis not present

## 2021-02-26 DIAGNOSIS — E161 Other hypoglycemia: Secondary | ICD-10-CM | POA: Diagnosis not present

## 2021-02-26 DIAGNOSIS — E162 Hypoglycemia, unspecified: Secondary | ICD-10-CM | POA: Diagnosis not present

## 2021-03-12 ENCOUNTER — Emergency Department: Payer: Managed Care, Other (non HMO)

## 2021-03-12 ENCOUNTER — Encounter: Payer: Self-pay | Admitting: Emergency Medicine

## 2021-03-12 ENCOUNTER — Inpatient Hospital Stay: Payer: Managed Care, Other (non HMO)

## 2021-03-12 ENCOUNTER — Other Ambulatory Visit: Payer: Self-pay

## 2021-03-12 ENCOUNTER — Inpatient Hospital Stay
Admission: EM | Admit: 2021-03-12 | Discharge: 2021-03-14 | DRG: 854 | Disposition: A | Discharge status: Home / Self Care | Payer: Managed Care, Other (non HMO) | Attending: Internal Medicine | Admitting: Internal Medicine

## 2021-03-12 DIAGNOSIS — D638 Anemia in other chronic diseases classified elsewhere: Secondary | ICD-10-CM | POA: Diagnosis present

## 2021-03-12 DIAGNOSIS — N182 Chronic kidney disease, stage 2 (mild): Secondary | ICD-10-CM | POA: Diagnosis present

## 2021-03-12 DIAGNOSIS — B954 Other streptococcus as the cause of diseases classified elsewhere: Secondary | ICD-10-CM | POA: Diagnosis present

## 2021-03-12 DIAGNOSIS — W208XXA Other cause of strike by thrown, projected or falling object, initial encounter: Secondary | ICD-10-CM | POA: Diagnosis present

## 2021-03-12 DIAGNOSIS — Z79899 Other long term (current) drug therapy: Secondary | ICD-10-CM | POA: Diagnosis not present

## 2021-03-12 DIAGNOSIS — M86672 Other chronic osteomyelitis, left ankle and foot: Secondary | ICD-10-CM | POA: Diagnosis not present

## 2021-03-12 DIAGNOSIS — N179 Acute kidney failure, unspecified: Secondary | ICD-10-CM | POA: Diagnosis present

## 2021-03-12 DIAGNOSIS — N189 Chronic kidney disease, unspecified: Secondary | ICD-10-CM | POA: Diagnosis not present

## 2021-03-12 DIAGNOSIS — D631 Anemia in chronic kidney disease: Secondary | ICD-10-CM | POA: Diagnosis present

## 2021-03-12 DIAGNOSIS — M869 Osteomyelitis, unspecified: Secondary | ICD-10-CM | POA: Diagnosis present

## 2021-03-12 DIAGNOSIS — A419 Sepsis, unspecified organism: Principal | ICD-10-CM | POA: Diagnosis present

## 2021-03-12 DIAGNOSIS — Z9641 Presence of insulin pump (external) (internal): Secondary | ICD-10-CM | POA: Diagnosis present

## 2021-03-12 DIAGNOSIS — M86172 Other acute osteomyelitis, left ankle and foot: Secondary | ICD-10-CM

## 2021-03-12 DIAGNOSIS — E1069 Type 1 diabetes mellitus with other specified complication: Secondary | ICD-10-CM | POA: Diagnosis present

## 2021-03-12 DIAGNOSIS — I129 Hypertensive chronic kidney disease with stage 1 through stage 4 chronic kidney disease, or unspecified chronic kidney disease: Secondary | ICD-10-CM | POA: Diagnosis present

## 2021-03-12 DIAGNOSIS — Z20822 Contact with and (suspected) exposure to covid-19: Secondary | ICD-10-CM | POA: Diagnosis present

## 2021-03-12 DIAGNOSIS — E1065 Type 1 diabetes mellitus with hyperglycemia: Secondary | ICD-10-CM

## 2021-03-12 DIAGNOSIS — Z794 Long term (current) use of insulin: Secondary | ICD-10-CM | POA: Diagnosis not present

## 2021-03-12 DIAGNOSIS — S98119A Complete traumatic amputation of unspecified great toe, initial encounter: Secondary | ICD-10-CM

## 2021-03-12 DIAGNOSIS — I10 Essential (primary) hypertension: Secondary | ICD-10-CM | POA: Diagnosis present

## 2021-03-12 LAB — COMPREHENSIVE METABOLIC PANEL
ALT: 13 U/L (ref 0–44)
AST: 12 U/L — ABNORMAL LOW (ref 15–41)
Albumin: 3.3 g/dL — ABNORMAL LOW (ref 3.5–5.0)
Alkaline Phosphatase: 93 U/L (ref 38–126)
Anion gap: 10 (ref 5–15)
BUN: 45 mg/dL — ABNORMAL HIGH (ref 6–20)
CO2: 23 mmol/L (ref 22–32)
Calcium: 8.9 mg/dL (ref 8.9–10.3)
Chloride: 99 mmol/L (ref 98–111)
Creatinine, Ser: 2.34 mg/dL — ABNORMAL HIGH (ref 0.61–1.24)
GFR, Estimated: 35 mL/min — ABNORMAL LOW (ref >60)
Glucose, Bld: 345 mg/dL — ABNORMAL HIGH (ref 70–99)
Potassium: 4.9 mmol/L (ref 3.5–5.1)
Sodium: 132 mmol/L — ABNORMAL LOW (ref 135–145)
Total Bilirubin: 0.8 mg/dL (ref 0.3–1.2)
Total Protein: 8.2 g/dL — ABNORMAL HIGH (ref 6.5–8.1)

## 2021-03-12 LAB — RESP PANEL BY RT-PCR (FLU A&B, COVID) ARPGX2
Influenza A by PCR: NEGATIVE
Influenza B by PCR: NEGATIVE
SARS Coronavirus 2 by RT PCR: NEGATIVE

## 2021-03-12 LAB — HEMOGLOBIN A1C
Hgb A1c MFr Bld: 9.2 % — ABNORMAL HIGH (ref 4.8–5.6)
Mean Plasma Glucose: 217.34 mg/dL

## 2021-03-12 LAB — CBG MONITORING, ED
Glucose-Capillary: 310 mg/dL — ABNORMAL HIGH (ref 70–99)
Glucose-Capillary: 368 mg/dL — ABNORMAL HIGH (ref 70–99)
Glucose-Capillary: 300 mg/dL — ABNORMAL HIGH (ref 70–99)

## 2021-03-12 LAB — CBC
HCT: 35.7 % — ABNORMAL LOW (ref 39.0–52.0)
Hemoglobin: 11 g/dL — ABNORMAL LOW (ref 13.0–17.0)
MCH: 24.9 pg — ABNORMAL LOW (ref 26.0–34.0)
MCHC: 30.8 g/dL (ref 30.0–36.0)
MCV: 80.8 fL (ref 80.0–100.0)
Platelets: 266 10*3/uL (ref 150–400)
RBC: 4.42 MIL/uL (ref 4.22–5.81)
RDW: 12.8 % (ref 11.5–15.5)
WBC: 15.4 10*3/uL — ABNORMAL HIGH (ref 4.0–10.5)
nRBC: 0 % (ref 0.0–0.2)

## 2021-03-12 LAB — GLUCOSE, CAPILLARY
Glucose-Capillary: 471 mg/dL — ABNORMAL HIGH (ref 70–99)
Glucose-Capillary: 433 mg/dL — ABNORMAL HIGH (ref 70–99)

## 2021-03-12 LAB — LACTIC ACID, PLASMA: Lactic Acid, Venous: 1.7 mmol/L (ref 0.5–1.9)

## 2021-03-12 MED ORDER — PANTOPRAZOLE SODIUM 40 MG PO TBEC
40.0000 mg | DELAYED_RELEASE_TABLET | Freq: Every day | ORAL | Status: DC
  Administered 2021-03-12 – 2021-03-14 (×2): 40 mg via ORAL
  Filled 2021-03-12 (×2): qty 1

## 2021-03-12 MED ORDER — ACETAMINOPHEN 650 MG RE SUPP: 650.0000 mg | Freq: Four times a day (QID) | RECTAL | Status: DC | PRN

## 2021-03-12 MED ORDER — VANCOMYCIN HCL 1250 MG/250ML IV SOLN
1250.0000 mg | INTRAVENOUS | Status: DC
  Administered 2021-03-12: 18:00:00 1250 mg via INTRAVENOUS
  Filled 2021-03-12 (×2): qty 250

## 2021-03-12 MED ORDER — INSULIN ASPART 100 UNIT/ML IJ SOLN
8.0000 [IU] | Freq: Once | INTRAMUSCULAR | Status: AC
  Administered 2021-03-12: 8 [IU] via INTRAVENOUS
  Filled 2021-03-12: qty 1

## 2021-03-12 MED ORDER — INSULIN ASPART 100 UNIT/ML IJ SOLN
0.0000 [IU] | Freq: Three times a day (TID) | INTRAMUSCULAR | Status: DC
  Administered 2021-03-12: 18:00:00 12 [IU] via SUBCUTANEOUS
  Filled 2021-03-12: qty 1

## 2021-03-12 MED ORDER — ACETAMINOPHEN 325 MG PO TABS
650.0000 mg | ORAL_TABLET | Freq: Once | ORAL | Status: AC
  Administered 2021-03-12: 650 mg via ORAL
  Filled 2021-03-12: qty 2

## 2021-03-12 MED ORDER — INSULIN ASPART 100 UNIT/ML IJ SOLN
0.0000 [IU] | Freq: Three times a day (TID) | INTRAMUSCULAR | Status: DC
  Administered 2021-03-13: 20 [IU] via SUBCUTANEOUS
  Administered 2021-03-13: 13:00:00 11 [IU] via SUBCUTANEOUS
  Filled 2021-03-12: qty 1

## 2021-03-12 MED ORDER — ONDANSETRON HCL 4 MG/2ML IJ SOLN: 4.0000 mg | Freq: Four times a day (QID) | INTRAMUSCULAR | Status: DC | PRN

## 2021-03-12 MED ORDER — SODIUM CHLORIDE 0.9 % IV SOLN
2.0000 g | Freq: Once | INTRAVENOUS | Status: AC
  Administered 2021-03-12: 2 g via INTRAVENOUS
  Filled 2021-03-12: qty 20

## 2021-03-12 MED ORDER — INSULIN DETEMIR 100 UNIT/ML ~~LOC~~ SOLN
10.0000 [IU] | Freq: Every day | SUBCUTANEOUS | Status: DC
  Administered 2021-03-13: 10 [IU] via SUBCUTANEOUS
  Filled 2021-03-12 (×2): qty 0.1

## 2021-03-12 MED ORDER — VANCOMYCIN HCL IN DEXTROSE 1-5 GM/200ML-% IV SOLN
1000.0000 mg | Freq: Once | INTRAVENOUS | Status: AC
  Administered 2021-03-12: 1000 mg via INTRAVENOUS
  Filled 2021-03-12: qty 200

## 2021-03-12 MED ORDER — KETOROLAC TROMETHAMINE 30 MG/ML IJ SOLN
15.0000 mg | Freq: Once | INTRAMUSCULAR | Status: AC
  Administered 2021-03-12: 15 mg via INTRAVENOUS
  Filled 2021-03-12: qty 1

## 2021-03-12 MED ORDER — ACETAMINOPHEN 325 MG PO TABS: 650.0000 mg | ORAL_TABLET | Freq: Four times a day (QID) | ORAL | Status: DC | PRN

## 2021-03-12 MED ORDER — SODIUM CHLORIDE 0.9 % IV SOLN
2.0000 g | Freq: Two times a day (BID) | INTRAVENOUS | Status: DC
  Administered 2021-03-12: 2 g via INTRAVENOUS
  Filled 2021-03-12 (×3): qty 2

## 2021-03-12 MED ORDER — ONDANSETRON HCL 4 MG PO TABS: 4.0000 mg | ORAL_TABLET | Freq: Four times a day (QID) | ORAL | Status: DC | PRN

## 2021-03-12 MED ORDER — VANCOMYCIN HCL IN DEXTROSE 1-5 GM/200ML-% IV SOLN: 1000.0000 mg | Freq: Once | INTRAVENOUS | Status: DC

## 2021-03-12 MED ORDER — INSULIN PUMP
SUBCUTANEOUS | Status: DC
  Filled 2021-03-12: qty 1

## 2021-03-12 MED ORDER — FERROUS SULFATE 325 (65 FE) MG PO TABS
325.0000 mg | ORAL_TABLET | Freq: Every day | ORAL | Status: DC
Start: 2021-03-13 — End: 2021-03-14
  Administered 2021-03-14: 325 mg via ORAL
  Filled 2021-03-12: qty 1

## 2021-03-12 MED ORDER — VANCOMYCIN HCL 750 MG/150ML IV SOLN
750.0000 mg | Freq: Once | INTRAVENOUS | Status: DC
Start: 2021-03-12 — End: 2021-03-12
  Filled 2021-03-12: qty 150

## 2021-03-12 MED ORDER — SODIUM CHLORIDE 0.9 % IV BOLUS
1000.0000 mL | Freq: Once | INTRAVENOUS | Status: AC
  Administered 2021-03-12: 1000 mL via INTRAVENOUS

## 2021-03-12 NOTE — ED Provider Notes (Signed)
[facility] Provider Note    Event Date/Time   First MD Initiated Contact with Patient 03/12/21 [id number]     (approximate)  History   Chief Complaint: Wound Infection  HPI  James [name] is a 41 y.o. male with a past medical history of diabetes who presents to the emergency department for chills pain and swelling to his left great toe.  According to the patient several days ago or approximately a week ago he dropped several boxes on his foot.  Patient states some pain at the time but the pain went away and he has had no issues with it until yesterday he noted that the toe was more painful and swollen appearing.  Today the swelling has worsened and the patient has been experiencing chills today.  Physical Exam   Triage Vital Signs: ED Triage Vitals  Enc Vitals Group     BP 03/12/21 0954 (!) 150/128     Pulse Rate 03/12/21 0954 (!) 101     Resp 03/12/21 0954 16     Temp 03/12/21 0954 98.8 F (37.1 C)     Temp Source 03/12/21 0954 Oral     SpO2 03/12/21 0954 100 %     Weight 03/12/21 0945 174 lb (78.9 kg)     Height 03/12/21 0945 6\' 2"  (1.88 m)     Head Circumference --      Peak Flow --      Pain Score 03/12/21 0944 0     Pain Loc --      Pain Edu? --      Excl. in GC? --     Most recent vital signs: Vitals:   03/12/21 1015 03/12/21 1100  BP:  (!) 152/71  Pulse:  (!) 107  Resp:  18  Temp: (!) 100.5 F (38.1 C)   SpO2:  100%    General: Awake, no distress.  CV:  Good peripheral perfusion.  Regular rhythm around 110 bpm Resp:  Normal effort.  Equal breath sounds bilaterally.  Abd:  No distention.  Soft, nontender.  No rebound or guarding. Other:  Significant swelling of the left great toe with mild amount of bloody type discharge.  No exudate expressed with palpation.  Erythematous and tender to palpation.   ED Results / Procedures / Treatments   RADIOLOGY  I personally reviewed x-ray images.  Appears to have significant swelling with some  bony destruction at the end of the toe. Radiology has read the x-ray as tuft destruction compatible osteomyelitis with moth-eaten appearance of the remaining distal phalanx compatible with osteomyelitis   MEDICATIONS ORDERED IN ED: Medications  insulin aspart (novoLOG) injection 8 Units (has no administration in time range)  vancomycin (VANCOCIN) IVPB 1000 mg/200 mL premix (1,000 mg Intravenous New Bag/Given 03/12/21 1029)  ketorolac (TORADOL) 30 MG/ML injection 15 mg (15 mg Intravenous Given 03/12/21 1017)  sodium chloride 0.9 % bolus 1,000 mL (1,000 mLs Intravenous New Bag/Given 03/12/21 1027)  acetaminophen (TYLENOL) tablet 650 mg (650 mg Oral Given 03/12/21 1038)     IMPRESSION / MDM / ASSESSMENT AND PLAN / ED COURSE  I reviewed the triage vital signs and the nursing notes.  Patient presents to the emergency department for a painful red swollen left great toe.  Patient is diabetic, noted to be tachycardic to 107, initially afebrile however on recheck patient is now with chills and fever to 100.5 meeting sepsis criteria.  White blood cell count has resulted at 15,000, chemistry shows moderate hyperglycemia we  will dose IV insulin as well as IV fluids, chronic kidney disease largely unchanged from prior..  Reassuringly lactate is 1.7.  X-ray appears consistent with osteomyelitis.  I spoke to podiatry who will be in to see the patient likely in the morning.  We will cover with IV vancomycin.  We will admit to the hospital service for further work-up and treatment.  CRITICAL CARE Performed by: Minna Antis   Total critical care time: 30 minutes  Critical care time was exclusive of separately billable procedures and treating other patients.  Critical care was necessary to treat or prevent imminent or life-threatening deterioration.  Critical care was time spent personally by me on the following activities: development of treatment plan with patient and/or surrogate as well as nursing,  discussions with consultants, evaluation of patient's response to treatment, examination of patient, obtaining history from patient or surrogate, ordering and performing treatments and interventions, ordering and review of laboratory studies, ordering and review of radiographic studies, pulse oximetry and re-evaluation of patient's condition.   FINAL CLINICAL IMPRESSION(S) / ED DIAGNOSES   Sepsis Osteomyelitis    Note:  This document was prepared using Dragon voice recognition software and may include unintentional dictation errors.   Minna Antis, MD 03/12/21 1143

## 2021-03-12 NOTE — ED Notes (Signed)
Pt insulin pump turned off. No longer receiving basal insulin and did not dose insulin for cbg 310 upon arrival. MD made aware. See new orders.

## 2021-03-12 NOTE — Sepsis Progress Note (Signed)
Elink is following this code sepsis ?

## 2021-03-12 NOTE — Anesthesia Preprocedure Evaluation (Addendum)
Anesthesia Evaluation  Patient identified by MRN, date of birth, ID band Patient awake    Reviewed: Allergy & Precautions, NPO status , Patient's Chart, lab work & pertinent test results  History of Anesthesia Complications Negative for: history of anesthetic complications  Airway Mallampati: II  TM Distance: >3 FB Neck ROM: full    Dental no notable dental hx.    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 negative psych ROS   GI/Hepatic Neg liver ROS, GERD  Controlled,Gastroparesis per chart, patient denies   Endo/Other  diabetes, Poorly Controlled, Type 1, Insulin Dependent  Renal/GU Renal disease (AKI)     Musculoskeletal   Abdominal Normal abdominal exam  (+)   Peds  Hematology  (+) Blood dyscrasia (Anemia in other chronic diseases), anemia ,   Anesthesia Other Findings Sepsis from osteomyelitis of the left great toe Hyponatremia  Reproductive/Obstetrics negative OB ROS                         Anesthesia Physical Anesthesia Plan  ASA: 3  Anesthesia Plan: MAC   Post-op Pain Management: Regional block* and Tylenol PO (pre-op)*   Induction: Intravenous  PONV Risk Score and Plan:   Airway Management Planned: Natural Airway and Nasal Cannula  Additional Equipment:   Intra-op Plan:   Post-operative Plan:   Informed Consent: I have reviewed the patients History and Physical, chart, labs and discussed the procedure including the risks, benefits and alternatives for the proposed anesthesia with the patient or authorized representative who has indicated his/her understanding and acceptance.     Dental advisory given  Plan Discussed with: Anesthesiologist, CRNA and Surgeon  Anesthesia Plan Comments:        Anesthesia Quick Evaluation

## 2021-03-12 NOTE — Consult Note (Addendum)
PHARMACY -  BRIEF ANTIBIOTIC NOTE   Pharmacy has received consult(s) for vancomycin from an ED provider. Patient is also ordered ceftriaxone. The patient's profile has been reviewed for ht/wt/allergies/indication/available labs.    One time order(s) placed for  --Vancomycin 1000 mg IV (already given) + vancomycin 750 mg IV to complete total loading dose of 1750 mg IV  Further antibiotics/pharmacy consults should be ordered by admitting physician if indicated.                       Thank you, Tressie Ellis 03/12/2021  12:02 PM

## 2021-03-12 NOTE — Progress Notes (Signed)
Cross Cover CBG 200-400 range. Uses insulin pump at home. Changed SENSITIVE sliding scale at mealtime to RESISTANT sliding scale ACHS

## 2021-03-12 NOTE — H&P (Signed)
History and Physical    Patient: James [name] POE:[id number] DOB: 01-24-1981 DOA: 03/12/2021 DOS: the patient was seen and examined on 03/12/2021 PCP: Pcp, No  Patient coming from: Home  Chief Complaint:  Chief Complaint  Patient presents with   Wound Infection    HPI: [name] is a 41 y.o. male with medical history significant for diabetes mellitus who presents to the emergency room for evaluation of pain and swelling involving his left great toe. Patient works for [employer] and states that he dropped a box on his left foot several days ago and then developed redness, swelling and pain involving his left great toe.  Girlfriend states she noted blood on his sock and request that he get checked out. He went to the urgent care to be evaluated and was referred to the ER for further evaluation. He has felt unwell for several days and has had chills.  He denies having any abdominal pain, no nausea, no vomiting, no changes in his bowel habits, no urinary symptoms, no headache, no dizziness, no lightheadedness, no palpitations or diaphoresis. Upon arrival to the ER he had a Tmax of 100.5 and was tachycardic with heart rate of 101. He received sepsis IV fluids as well as a dose of Rocephin and vancomycin  Review of Systems: As mentioned in the history of present illness. All other systems reviewed and are negative. Past Medical History:  Diagnosis Date   Diabetes mellitus without complication (HCC)    History reviewed. No pertinent surgical history. Social History:  reports that he has never smoked. He has never used smokeless tobacco. He reports that he does not currently use alcohol. He reports that he does not currently use drugs.  No Known Allergies  History reviewed. No pertinent family history.  Prior to Admission medications   Medication Sig Start Date End Date Taking? Authorizing Provider  ferrous sulfate 325 (65 FE) MG tablet Take 325 mg by mouth daily with breakfast.     [provider]  insulin aspart (NOVOLOG) 100 UNIT/ML injection Inject 3-8 Units into the skin See admin instructions. Inject 8u under the skin three times daily before meals and inject 3u under the skin before snacks    [provider]  pantoprazole (PROTONIX) 40 MG tablet Take 40 mg by mouth daily.    [provider]    Physical Exam: Vitals:   03/12/21 1145 03/12/21 1200 03/12/21 1215 03/12/21 1230  BP:  133/61  123/60  Pulse: (!) 107 (!) 101 100 (!) 101  Resp:    18  Temp:    100.1 F (37.8 C)  TempSrc:    Oral  SpO2: 98% 98% 98% 97%  Weight:      Height:       Physical Exam Vitals and nursing note reviewed.  Constitutional:      Appearance: He is normal weight.  HENT:     Head: Normocephalic and atraumatic.     Nose: Nose normal.     Mouth/Throat:     Mouth: Mucous membranes are moist.  Eyes:     Pupils: Pupils are equal, round, and reactive to light.  Cardiovascular:     Rate and Rhythm: Tachycardia present.  Pulmonary:     Effort: Pulmonary effort is normal.     Breath sounds: Normal breath sounds.  Abdominal:     General: Abdomen is flat. Bowel sounds are normal.     Palpations: Abdomen is soft.  Musculoskeletal:        General:  Tenderness and deformity present.     Cervical back: Normal range of motion and neck supple.     Comments: Right toe swelling, redness and pain  Skin:    General: Skin is warm and dry.  Neurological:     General: No focal deficit present.     Mental Status: He is alert and oriented to person, place, and time.  Psychiatric:        Mood and Affect: Mood normal.        Behavior: Behavior normal.     Data Reviewed: Notes from primary care and specialist visits, past discharge summaries. Prior diagnostic testing as applicable to current admission diagnoses Updated medications and problem lists for reconciliation ED course, including vitals, labs, imaging, treatment and response to treatment Triage notes  and ED providers notes White count 15.4, sodium 132, glucose 345, BUN 45, creatinine 2.34 compared to baseline of 1.43  Left foot x-ray shows destruction of the tuft of the great toe distal phalanx compatible with osteomyelitis.  There is a moth-eaten appearance of the remaining portion of the distal phalanx compatible with osteomyelitis. There are no new results to review at this time.  Assessment and Plan: Principal Problem:   Sepsis (HCC) Active Problems:   HTN (hypertension)   Anemia in other chronic diseases classified elsewhere   Uncontrolled type 1 diabetes mellitus with hyperglycemia, with long-term current use of insulin (HCC)   Osteomyelitis (HCC)   Sepsis from osteomyelitis of the left great toe As evidenced by fever with a Tmax of 100.5, tachycardia, marked leukocytosis and left foot redness/swelling with imaging suggestive of osteomyelitis Continue IV fluid resuscitation Place patient empirically on antibiotic therapy with vancomycin and cefepime Follow-up results of blood cultures Consult podiatry for osteomyelitis of the left great toe      Acute kidney injury Most likely secondary to dehydration from osmotic diuresis related to hyperglycemia Patient has a baseline serum creatinine of 1.43 and today on admission it is 2.34 Continue IV fluid hydration Avoid nephrotoxic agents Repeat renal parameters in a.m.     Uncontrolled type 1 diabetes mellitus with hyperglycemia Patient noted to have significant hyperglycemia He uses an insulin pump which is currently off during this hospitalization We will place patient on Lantus 10 units nightly with sliding scale coverage Adjust insulin to optimize glycemic control     Hyponatremia Most likely secondary to hyperglycemia Expect improvement in serum sodium following resolution of hyperglycemia     Anemia of chronic disease Stable  Advance Care Planning:   Code Status: Full Code   Consults:  Podiatry  Family Communication: Greater than 50% of time was spent discussing patient's condition and plan of care with him at the bedside.  All questions and concerns have been addressed.  He verbalizes understanding and agrees with the plan.  Severity of Illness: The appropriate patient status for this patient is INPATIENT. Inpatient status is judged to be reasonable and necessary in order to provide the required intensity of service to ensure the patient's safety. The patient's presenting symptoms, physical exam findings, and initial radiographic and laboratory data in the context of their chronic comorbidities is felt to place them at high risk for further clinical deterioration. Furthermore, it is not anticipated that the patient will be medically stable for discharge from the hospital within 2 midnights of admission.   * I certify that at the point of admission it is my clinical judgment that the patient will require inpatient hospital care spanning beyond 2 midnights from  the point of admission due to high intensity of service, high risk for further deterioration and high frequency of surveillance required.*  Author: Lucile Shutters, MD 03/12/2021 1:46 PM  For on call review [email or web address].

## 2021-03-12 NOTE — Progress Notes (Signed)
CODE SEPSIS - PHARMACY COMMUNICATION  **Broad Spectrum Antibiotics should be administered within 1 hour of Sepsis diagnosis**  Time Code Sepsis Called/Page Received: 1143  Antibiotics Ordered: Ceftriaxone + vancomycin  Time of 1st antibiotic administration: 1151 (ceftriaxone), vancomycin (1029)  Additional action taken by pharmacy: N/A   Benita Gutter 03/12/2021  12:03 PM

## 2021-03-12 NOTE — ED Triage Notes (Addendum)
Pt reports dropped something on his left foot at some point and crushed his great toe. Pt went to UC today and was sent to ED for treatment of infection. Pt with hx of diabetes and needs wound care. Pt reports he has not felt well over the last few days and that os what prompted him to seek medical care.

## 2021-03-12 NOTE — Consult Note (Signed)
Pharmacy Antibiotic Note  James [name] is a 41 y.o. male with medical history including diabetes admitted on 03/12/2021 with  left great toe injury / concern for osteomyelitis .  Pharmacy has been consulted for cefepime and vancomycin dosing.  Plan:  Cefepime 2 g IV q12h  Vancomycin 1.25 g IV q24h --Calculated AUC: 513, Cmin 13 --Daily Scr per protocol --Levels at steady state as clinically indicated   Height: 6\' 2"  (188 cm) Weight: 78.9 kg (174 lb) IBW/kg (Calculated) : 82.2  Temp (24hrs), Avg:99.8 F (37.7 C), Min:98.8 F (37.1 C), Max:100.5 F (38.1 C)  Recent Labs  Lab 03/12/21 1012  WBC 15.4*  CREATININE 2.34*  LATICACIDVEN 1.7    Estimated Creatinine Clearance: 46.4 mL/min (A) (by C-G formula based on SCr of 2.34 mg/dL (H)).    No Known Allergies  Antimicrobials this admission: Ceftriaxone 2/18 x 1 Cefepime 2/18 >>  Vancomycin 2/18 >>   Dose adjustments this admission: N/A  Microbiology results: 2/18 BCx: pending  Thank you for allowing pharmacy to be a part of this patients care.  3/18 03/12/2021 1:30 PM

## 2021-03-13 ENCOUNTER — Encounter
Admission: EM | Disposition: A | Discharge status: Home / Self Care | Payer: Self-pay | Source: Home / Self Care | Attending: Internal Medicine

## 2021-03-13 ENCOUNTER — Inpatient Hospital Stay: Payer: Managed Care, Other (non HMO) | Admitting: Anesthesiology

## 2021-03-13 ENCOUNTER — Inpatient Hospital Stay: Payer: Managed Care, Other (non HMO)

## 2021-03-13 ENCOUNTER — Other Ambulatory Visit: Payer: Self-pay

## 2021-03-13 DIAGNOSIS — N179 Acute kidney failure, unspecified: Secondary | ICD-10-CM

## 2021-03-13 DIAGNOSIS — N189 Chronic kidney disease, unspecified: Secondary | ICD-10-CM

## 2021-03-13 DIAGNOSIS — M869 Osteomyelitis, unspecified: Secondary | ICD-10-CM

## 2021-03-13 DIAGNOSIS — M86672 Other chronic osteomyelitis, left ankle and foot: Secondary | ICD-10-CM

## 2021-03-13 DIAGNOSIS — A419 Sepsis, unspecified organism: Secondary | ICD-10-CM | POA: Diagnosis not present

## 2021-03-13 DIAGNOSIS — D638 Anemia in other chronic diseases classified elsewhere: Secondary | ICD-10-CM

## 2021-03-13 DIAGNOSIS — E1065 Type 1 diabetes mellitus with hyperglycemia: Secondary | ICD-10-CM | POA: Diagnosis not present

## 2021-03-13 HISTORY — PX: AMPUTATION TOE: SHX6595

## 2021-03-13 LAB — BASIC METABOLIC PANEL
Anion gap: 7 (ref 5–15)
BUN: 41 mg/dL — ABNORMAL HIGH (ref 6–20)
CO2: 26 mmol/L (ref 22–32)
Calcium: 8.9 mg/dL (ref 8.9–10.3)
Chloride: 101 mmol/L (ref 98–111)
Creatinine, Ser: 1.78 mg/dL — ABNORMAL HIGH (ref 0.61–1.24)
GFR, Estimated: 49 mL/min — ABNORMAL LOW (ref >60)
Glucose, Bld: 225 mg/dL — ABNORMAL HIGH (ref 70–99)
Potassium: 4.6 mmol/L (ref 3.5–5.1)
Sodium: 134 mmol/L — ABNORMAL LOW (ref 135–145)

## 2021-03-13 LAB — GLUCOSE, CAPILLARY
Glucose-Capillary: 175 mg/dL — ABNORMAL HIGH (ref 70–99)
Glucose-Capillary: 176 mg/dL — ABNORMAL HIGH (ref 70–99)
Glucose-Capillary: 197 mg/dL — ABNORMAL HIGH (ref 70–99)
Glucose-Capillary: 283 mg/dL — ABNORMAL HIGH (ref 70–99)
Glucose-Capillary: 283 mg/dL — ABNORMAL HIGH (ref 70–99)
Glucose-Capillary: 305 mg/dL — ABNORMAL HIGH (ref 70–99)
Glucose-Capillary: 369 mg/dL — ABNORMAL HIGH (ref 70–99)

## 2021-03-13 LAB — CBC
HCT: 34.1 % — ABNORMAL LOW (ref 39.0–52.0)
Hemoglobin: 10.6 g/dL — ABNORMAL LOW (ref 13.0–17.0)
MCH: 24.8 pg — ABNORMAL LOW (ref 26.0–34.0)
MCHC: 31.1 g/dL (ref 30.0–36.0)
MCV: 79.9 fL — ABNORMAL LOW (ref 80.0–100.0)
Platelets: 264 10*3/uL (ref 150–400)
RBC: 4.27 MIL/uL (ref 4.22–5.81)
RDW: 12.7 % (ref 11.5–15.5)
WBC: 12.4 10*3/uL — ABNORMAL HIGH (ref 4.0–10.5)
nRBC: 0 % (ref 0.0–0.2)

## 2021-03-13 SURGERY — AMPUTATION, TOE
Anesthesia: Monitor Anesthesia Care | Site: Toe | Laterality: Left

## 2021-03-13 MED ORDER — OXYCODONE HCL 5 MG/5ML PO SOLN: 5.0000 mg | Freq: Once | ORAL | Status: DC | PRN

## 2021-03-13 MED ORDER — FENTANYL CITRATE (PF) 100 MCG/2ML IJ SOLN: 25.0000 ug | INTRAMUSCULAR | Status: DC | PRN

## 2021-03-13 MED ORDER — LIDOCAINE HCL (PF) 2 % IJ SOLN
INTRAMUSCULAR | Status: AC
  Filled 2021-03-13: qty 5

## 2021-03-13 MED ORDER — FENTANYL CITRATE (PF) 100 MCG/2ML IJ SOLN
INTRAMUSCULAR | Status: DC | PRN
Start: 2021-03-13 — End: 2021-03-13
  Administered 2021-03-13 (×4): 25 ug via INTRAVENOUS

## 2021-03-13 MED ORDER — BUPIVACAINE HCL (PF) 0.5 % IJ SOLN
INTRAMUSCULAR | Status: AC
  Filled 2021-03-13: qty 30

## 2021-03-13 MED ORDER — PROMETHAZINE HCL 25 MG/ML IJ SOLN: 6.2500 mg | INTRAMUSCULAR | Status: DC | PRN

## 2021-03-13 MED ORDER — INSULIN ASPART 100 UNIT/ML IJ SOLN
0.0000 [IU] | Freq: Three times a day (TID) | INTRAMUSCULAR | Status: DC
  Administered 2021-03-13: 18:00:00 7 [IU] via SUBCUTANEOUS
  Administered 2021-03-14: 2 [IU] via SUBCUTANEOUS
  Filled 2021-03-13 (×2): qty 1

## 2021-03-13 MED ORDER — PROPOFOL 10 MG/ML IV BOLUS
INTRAVENOUS | Status: AC
  Filled 2021-03-13: qty 20

## 2021-03-13 MED ORDER — OXYCODONE HCL 5 MG PO TABS: 5.0000 mg | ORAL_TABLET | Freq: Once | ORAL | Status: DC | PRN

## 2021-03-13 MED ORDER — FENTANYL CITRATE (PF) 100 MCG/2ML IJ SOLN
INTRAMUSCULAR | Status: AC
  Filled 2021-03-13: qty 2

## 2021-03-13 MED ORDER — PROPOFOL 500 MG/50ML IV EMUL
INTRAVENOUS | Status: DC | PRN
  Administered 2021-03-13: 75 ug/kg/min via INTRAVENOUS
  Administered 2021-03-13: 125 ug/kg/min via INTRAVENOUS

## 2021-03-13 MED ORDER — VANCOMYCIN HCL 1500 MG/300ML IV SOLN
1500.0000 mg | INTRAVENOUS | Status: DC
  Administered 2021-03-13: 1500 mg via INTRAVENOUS
  Filled 2021-03-13: qty 300

## 2021-03-13 MED ORDER — INSULIN ASPART 100 UNIT/ML IJ SOLN
0.0000 [IU] | Freq: Every day | INTRAMUSCULAR | Status: DC
  Administered 2021-03-13: 22:00:00 3 [IU] via SUBCUTANEOUS
  Filled 2021-03-13: qty 1

## 2021-03-13 MED ORDER — INSULIN ASPART 100 UNIT/ML IJ SOLN
4.0000 [IU] | Freq: Three times a day (TID) | INTRAMUSCULAR | Status: DC
  Administered 2021-03-13 – 2021-03-14 (×2): 4 [IU] via SUBCUTANEOUS
  Filled 2021-03-13 (×2): qty 1

## 2021-03-13 MED ORDER — LIDOCAINE HCL (PF) 1 % IJ SOLN
INTRAMUSCULAR | Status: AC
  Filled 2021-03-13: qty 30

## 2021-03-13 MED ORDER — LACTATED RINGERS IV SOLN: INTRAVENOUS | Status: DC | PRN

## 2021-03-13 MED ORDER — SODIUM CHLORIDE 0.9 % IV SOLN
2.0000 g | Freq: Three times a day (TID) | INTRAVENOUS | Status: DC
  Administered 2021-03-13 – 2021-03-14 (×3): 2 g via INTRAVENOUS
  Filled 2021-03-13 (×4): qty 2

## 2021-03-13 MED ORDER — LIDOCAINE HCL (CARDIAC) PF 100 MG/5ML IV SOSY
PREFILLED_SYRINGE | INTRAVENOUS | Status: DC | PRN
  Administered 2021-03-13: 40 mg via INTRAVENOUS

## 2021-03-13 MED ORDER — LIDOCAINE HCL (PF) 1 % IJ SOLN
INTRAMUSCULAR | Status: DC | PRN
  Administered 2021-03-13: 10 mL via SURGICAL_CAVITY

## 2021-03-13 MED ORDER — INSULIN DETEMIR 100 UNIT/ML ~~LOC~~ SOLN
20.0000 [IU] | Freq: Every day | SUBCUTANEOUS | Status: DC
  Administered 2021-03-13: 20 [IU] via SUBCUTANEOUS
  Filled 2021-03-13 (×2): qty 0.2

## 2021-03-13 MED ORDER — ACETAMINOPHEN 10 MG/ML IV SOLN: 1000.0000 mg | Freq: Once | INTRAVENOUS | Status: DC | PRN

## 2021-03-13 MED ORDER — 0.9 % SODIUM CHLORIDE (POUR BTL) OPTIME
TOPICAL | Status: DC | PRN
Start: 2021-03-13 — End: 2021-03-13
  Administered 2021-03-13: 1000 mL

## 2021-03-13 MED ORDER — ACETAMINOPHEN 500 MG PO TABS
1000.0000 mg | ORAL_TABLET | Freq: Once | ORAL | Status: DC
Start: 2021-03-13 — End: 2021-03-13

## 2021-03-13 MED ORDER — DEXMEDETOMIDINE (PRECEDEX) IN NS 20 MCG/5ML (4 MCG/ML) IV SYRINGE
PREFILLED_SYRINGE | INTRAVENOUS | Status: DC | PRN
  Administered 2021-03-13: 4 ug via INTRAVENOUS

## 2021-03-13 SURGICAL SUPPLY — 45 items
BAG COUNTER SPONGE SURGICOUNT (BAG) IMPLANT
BLADE OSC/SAGITTAL MD 5.5X18 (BLADE) IMPLANT
BLADE OSC/SAGITTAL MD 9X18.5 (BLADE) ×1 IMPLANT
BLADE SURG 15 STRL LF DISP TIS (BLADE) ×1 IMPLANT
BLADE SURG 15 STRL SS (BLADE) ×1
BLADE SURG MINI STRL (BLADE) ×2 IMPLANT
BLADE SURG SZ10 CARB STEEL (BLADE) ×1 IMPLANT
BNDG COHESIVE 4X5 TAN ST LF (GAUZE/BANDAGES/DRESSINGS) ×2 IMPLANT
BNDG ELASTIC 4X5.8 VLCR STR LF (GAUZE/BANDAGES/DRESSINGS) ×2 IMPLANT
BNDG ESMARK 4X12 TAN STRL LF (GAUZE/BANDAGES/DRESSINGS) ×2 IMPLANT
BNDG GAUZE ELAST 4 BULKY (GAUZE/BANDAGES/DRESSINGS) ×3 IMPLANT
COVER LIGHT HANDLE STERIS (MISCELLANEOUS) ×4 IMPLANT
CUFF TOURN SGL QUICK 18X4 (TOURNIQUET CUFF) ×2 IMPLANT
DRAPE FLUOR MINI C-ARM 54X84 (DRAPES) ×2 IMPLANT
DRSG PAD ABDOMINAL 8X10 ST (GAUZE/BANDAGES/DRESSINGS) ×2 IMPLANT
DURAPREP 26ML APPLICATOR (WOUND CARE) ×2 IMPLANT
ELECT REM PT RETURN 9FT ADLT (ELECTROSURGICAL) ×2
ELECTRODE REM PT RTRN 9FT ADLT (ELECTROSURGICAL) ×1 IMPLANT
GAUZE SPONGE 4X4 12PLY STRL (GAUZE/BANDAGES/DRESSINGS) IMPLANT
GLOVE SURG ENC MOIS LTX SZ7 (GLOVE) ×2 IMPLANT
GLOVE SURG UNDER POLY LF SZ7.5 (GLOVE) ×2 IMPLANT
GOWN STRL REUS W/ TWL LRG LVL3 (GOWN DISPOSABLE) ×2 IMPLANT
GOWN STRL REUS W/ TWL XL LVL3 (GOWN DISPOSABLE) ×1 IMPLANT
GOWN STRL REUS W/TWL LRG LVL3 (GOWN DISPOSABLE) ×2
GOWN STRL REUS W/TWL XL LVL3 (GOWN DISPOSABLE) ×1
HANDPIECE INTERPULSE COAX TIP (DISPOSABLE) ×1
HANDPIECE VERSAJET DEBRIDEMENT (MISCELLANEOUS) IMPLANT
IV NS 1000ML (IV SOLUTION) ×1
IV NS 1000ML BAXH (IV SOLUTION) ×1 IMPLANT
KIT BASIN OR (CUSTOM PROCEDURE TRAY) ×2 IMPLANT
KIT TURNOVER KIT A (KITS) IMPLANT
MANIFOLD NEPTUNE II (INSTRUMENTS) ×2 IMPLANT
NEEDLE HYPO 22GX1.5 SAFETY (NEEDLE) IMPLANT
NS IRRIG 1000ML POUR BTL (IV SOLUTION) ×2 IMPLANT
PACK DRAINAGE HVY KERLIX W/SPG (MISCELLANEOUS) ×2 IMPLANT
PACK EXTREMITY ARMC (MISCELLANEOUS) ×2 IMPLANT
PAD ARMBOARD 7.5X6 YLW CONV (MISCELLANEOUS) ×4 IMPLANT
PULSAVAC PLUS IRRIG FAN TIP (DISPOSABLE) ×2
SET HNDPC FAN SPRY TIP SCT (DISPOSABLE) ×1 IMPLANT
STAPLER SKIN PROX 35W (STAPLE) ×2 IMPLANT
SUT PROLENE 2 0 FS (SUTURE) ×2 IMPLANT
SUT PROLENE 3 0 PS 2 (SUTURE) ×3 IMPLANT
SWAB CULTURE AMIES ANAERIB BLU (MISCELLANEOUS) IMPLANT
SYR CONTROL 10ML LL (SYRINGE) IMPLANT
TIP FAN IRRIG PULSAVAC PLUS (DISPOSABLE) IMPLANT

## 2021-03-13 NOTE — Anesthesia Postprocedure Evaluation (Signed)
Anesthesia Post Note  Patient: James [name]  Procedure(s) Performed: AMPUTATION BIG TOE (Left: Toe)  Patient location during evaluation: PACU Anesthesia Type: MAC Level of consciousness: awake and alert Pain management: pain level controlled Vital Signs Assessment: post-procedure vital signs reviewed and stable Respiratory status: spontaneous breathing, nonlabored ventilation and respiratory function stable Cardiovascular status: blood pressure returned to baseline and stable Postop Assessment: no apparent nausea or vomiting Anesthetic complications: no   No notable events documented.   Last Vitals:  Vitals:   03/13/21 0907 03/13/21 1235  BP: (!) 151/86 (!) 164/83  Pulse: 82 90  Resp: 18 18  Temp: 36.8 C 37 C  SpO2: 99% 100%    Last Pain:  Vitals:   03/13/21 1235  TempSrc: Oral  PainSc:                  [name]

## 2021-03-13 NOTE — Assessment & Plan Note (Addendum)
Status post amputation of the left first toe by Dr. Allena Katz on 03/14/2021.  Okay to discharge as per Dr. Allena Katz.  Patient will discharge home on doxycycline.

## 2021-03-13 NOTE — Assessment & Plan Note (Addendum)
Clinical sepsis, present on admission with acute kidney injury, fever, tachycardia and leukocytosis.  Patient started on vancomycin and cefepime.  Patient has osteomyelitis of the left first toe.  As per podiatry okay to discharge on doxycycline.

## 2021-03-13 NOTE — Transfer of Care (Signed)
Immediate Anesthesia Transfer of Care Note  Patient: James [name]  Procedure(s) Performed: AMPUTATION TOE (Left: Toe)  Patient Location: PACU  Anesthesia Type:MAC  Level of Consciousness: awake  Airway & Oxygen Therapy: Patient Spontanous Breathing  Post-op Assessment: Report given to RN  Post vital signs: stable  Last Vitals:  Vitals Value Taken Time  BP 131/82 03/13/21 0825  Temp 98.7   Pulse 81 03/13/21 0827  Resp 13 03/13/21 0827  SpO2 99 % 03/13/21 0827  Vitals shown include unvalidated device data.  Last Pain:  Vitals:   03/13/21 0502  TempSrc: Oral  PainSc:          Complications: No notable events documented.

## 2021-03-13 NOTE — Op Note (Addendum)
Surgeon: Surgeon(s): Candelaria Stagers, DPM  Assistants: None Pre-operative diagnosis: osteomyelitis Right foot  Post-operative diagnosis: same Procedure: Procedure(s) (LRB): AMPUTATION TOE (Left)  Pathology: * No specimens in log *  Pertinent Intra-op findings: Osteomyelitis noted of the left great toe distal phalanx.  Clean margins are hard indurated Anesthesia: General  Hemostasis: None EBL: Minimal Materials: 3-0 Prolene Injectables: 10 cc of half percent Marcaine plain and 1% lidocaine plain one-to-one mixture Complications: None  Indications for surgery: A 41 y.o. male presents with left hallux osteomyelitis secondary to trauma. Patient has failed all conservative therapy including but not limited to local wound care and IV antibiotics. He wishes to have surgical correction of the foot/deformity. It was determined that patient would benefit from left partial hallux amputation. Informed surgical risk consent was reviewed and read aloud to the patient.  I reviewed the films.  I have discussed my findings with the patient in great detail.  I have discussed all risks including but not limited to infection, stiffness, scarring, limp, disability, deformity, damage to blood vessels and nerves, numbness, poor healing, need for braces, arthritis, chronic pain, amputation, death.  All benefits and realistic expectations discussed in great detail.  I have made no promises as to the outcome.  I have provided realistic expectations.  I have offered the patient a 2nd opinion, which they have declined and assured me they preferred to proceed despite the risks   Procedure in detail: The patient was both verbally and visually identified by myself, the nursing staff, and anesthesia staff in the preoperative holding area. They were then transferred to the operating room and placed on the operative table in supine position.  Attention was directed to the left hallux.  [place] style incision was delineated  using skin marker.  Using blade the incision was carried down to epidermal dermal layer down to the level of the bone.  Superficial purulent drainage was noted and culture was taken sent to microbiology.  The digit was disarticulated at the IPJ.   This was sent to pathology in standard technique.  At this time I believe patient will benefit from removal of the head of the proximal phalanx for closure.  Using sagittal saw the head of the proximal phalanx was removed in standard technique this was sent to pathology as well.  No clinical signs of osteomyelitis was noted in the head of the proximal phalanx.  The clean bone was hard indurated.  The wound was thoroughly irrigated with normal saline solution.  The wound was primarily closed with 3-0 Prolene in simple interrupted suture technique.  The incision was dressed with Betadine wet-to-dry, Kerlix, Ace bandage.  All bony prominences were adequately padded.  At the conclusion of the procedure the patient was awoken from anesthesia and found to have tolerated the procedure well any complications. There were transferred to PACU with vital signs stable and vascular status intact.  James [name], DPM

## 2021-03-13 NOTE — Interval H&P Note (Signed)
History and Physical Interval Note:  03/13/2021 7:42 AM  James [name]  has presented today for surgery, with the diagnosis of osteomyelitis Right foot.  The various methods of treatment have been discussed with the patient and family. After consideration of risks, benefits and other options for treatment, the patient has consented to  Procedure(s): AMPUTATION TOE (Left) as a surgical intervention.  The patient's history has been reviewed, patient examined, no change in status, stable for surgery.  I have reviewed the patient's chart and labs.  Questions were answered to the patient's satisfaction.     Candelaria Stagers

## 2021-03-13 NOTE — Consult Note (Signed)
°  Subjective:  Patient ID: James [name], male    DOB: [birth year],  MRN: [id number]  A 41 y.o. male with past medical history of uncontrolled diabetes type 1 with A1c of 9.2%.  Patient states that he may have had some boxes drop at [employer] that led to infection of cellulitis up to the MPJ joint.  Patient stated minimal swelling and pain.  He went to urgent care where he was referred to the ER for sepsis and infection.  Once he arrived to the ER he had osteomyelitis of the great toe distal phalanx.  Podiatry was consulted to evaluate for surgical intervention.  He denies any other acute complaints.  No nausea fever.  He has some chills.  Objective:   Vitals:   03/13/21 0025 03/13/21 0502  BP: (!) 154/89 131/79  Pulse: 96 84  Resp: 20 18  Temp: 99.8 F (37.7 C) 98.8 F (37.1 C)  SpO2: 99% 99%   General AA&O x3. Normal mood and affect.  Vascular Dorsalis pedis and posterior tibial pulses 2/4 bilat. Brisk capillary refill to all digits. Pedal hair present.  Neurologic Epicritic sensation grossly intact.  Dermatologic Left great toe ulceration at the previous nail of the hallux.  There does not appear to be any nail left may have fallen off due to the injury.  The underlying wound is leading down to the bone consistent with osteomyelitis.  No purulent drainage expressed.  Cellulitis up to the MPJ.  Swelling noted to the great toe.  No malodor present.  Orthopedic: MMT 5/5 in dorsiflexion, plantarflexion, inversion, and eversion. Normal joint ROM without pain or crepitus.   IMPRESSION: Osteomyelitis of the great toe distal phalanx with adjacent soft tissue ulcer and soft tissue gas. Reactive marrow edema versus early osteomyelitis of the great toe proximal phalanx. Small great toe IP joint effusion. Fluid collection along the plantar aspect of the great toe at the level of the interphalangeal joint measuring 2.2 x 1.0 x 1.7 cm, possibly an abscess Assessment & Plan:  Patient was evaluated  and treated and all questions answered.  Left great toe osteomyelitis -All questions and concerns were discussed with the patient in extensive detail -Given that patient has osteomyelitis and he has been n.p.o. the decision today to take the patient to the operating room right away for amputation of the great toe partially.  I discussed my surgical plan with the patient in extensive detail he states understand like to proceed with surgery. -Given his uncontrolled diabetes he is at high risk of further loss of the foot the toe/leg.  I discussed this with the patient he states understanding. -He can be weightbearing as tolerated with a surgical shoe. -N.p.o. confirmed after midnight.   Candelaria Stagers, DPM  Accessible via secure chat for questions or concerns.

## 2021-03-13 NOTE — Progress Notes (Signed)
°  Progress Note   Patient: James [name] [id number] DOB: 02/25/1980 DOA: 03/12/2021     1 DOS: the patient was seen and examined on 03/13/2021    Assessment and Plan: * Sepsis ([place])- (present on admission) Clinical sepsis, present on admission with acute kidney injury, fever, tachycardia and leukocytosis.  Patient started on vancomycin and cefepime.  Patient has osteomyelitis of the left first toe.  Osteomyelitis ([place])- (present on admission) Status post amputation of the left first toe by Dr. Posey Pronto this morning.  He will follow-up bone cultures.  From his standpoint can potentially go home tomorrow.  Acute kidney injury superimposed on CKD ([place]) Acute kidney injury on chronic kidney disease stage II.  Baseline creatinine 1.43.  Creatinine 2.34 upon admission and down to 1.78 today.  Uncontrolled type 1 diabetes mellitus with hyperglycemia, with long-term current use of insulin ([place]) Patient uses insulin pump at home and was given a small dose of Levemir last night.  Potentially can go back on insulin pump this evening.  Hemoglobin A1c elevated at 9.2.  Anemia in other chronic diseases classified elsewhere- (present on admission) Hemoglobin 10.6 today.  Last ferritin seen in care everywhere 165.3.        Subjective: Patient seen after her procedure.  A little bit sleepy after procedure today with anesthesia.  Came in with pain and swelling involving his left great toe and was found to have an osteomyelitis.  Physical Exam: Vitals:   03/13/21 0825 03/13/21 0830 03/13/21 0845 03/13/21 0907  BP: 131/82 122/76 139/86 (!) 151/86  Pulse: 81 80 80 82  Resp: 13 13 12 18   Temp: (!) 97.4 F (36.3 C)  98 F (36.7 C) 98.2 F (36.8 C)  TempSrc:    Oral  SpO2: 98% 98% 98% 99%  Weight:      Height:       Physical Exam HENT:     Head: Normocephalic.     Mouth/Throat:     Pharynx: No oropharyngeal exudate.  Eyes:     General: Lids are normal.     Conjunctiva/sclera: Conjunctivae  normal.  Cardiovascular:     Rate and Rhythm: Normal rate and regular rhythm.     Heart sounds: Normal heart sounds, S1 normal and S2 normal.  Pulmonary:     Breath sounds: No decreased breath sounds, wheezing, rhonchi or rales.  Abdominal:     Palpations: Abdomen is soft.     Tenderness: There is no abdominal tenderness.  Musculoskeletal:     Right lower leg: No swelling.     Left lower leg: No swelling.  Skin:    General: Skin is warm.     Comments: Left foot wrapped.  Neurological:     Mental Status: He is alert and oriented to person, place, and time.     Data Reviewed: Hemoglobin 10.6.  White blood cell count 12.4.  Creatinine down to 1.78.  Family Communication: Declined  Disposition: Status is: Inpatient Remains inpatient appropriate because: Had amputation of left first toe today  Planned Discharge Destination: Home  Author: Loletha Grayer, MD 03/13/2021 10:36 AM  For on call review [email or web address].

## 2021-03-13 NOTE — Consult Note (Signed)
Pharmacy Antibiotic Note  James [name] is a 41 y.o. male with medical history including diabetes admitted on 03/12/2021 with  left great toe injury / concern for osteomyelitis .  Pharmacy has been consulted for cefepime and vancomycin dosing.  Plan:  Cefepime 2 g IV q12h > q8H due to improvement in CrCl.   Vancomycin 1.25 g IV q24h > vancomycin 1500 mg daily due to improvement in Scr. Predicted AUC 480, Goal AUC 400-550. Vd 0.72, Scr 1.78, TBW for CrCl and Ke.    Height: 6\' 2"  (188 cm) Weight: 78.9 kg (174 lb) IBW/kg (Calculated) : 82.2  Temp (24hrs), Avg:98.9 F (37.2 C), Min:97.4 F (36.3 C), Max:100.5 F (38.1 C)  Recent Labs  Lab 03/12/21 1012 03/13/21 0411  WBC 15.4* 12.4*  CREATININE 2.34* 1.78*  LATICACIDVEN 1.7  --      Estimated Creatinine Clearance: 60.9 mL/min (A) (by C-G formula based on SCr of 1.78 mg/dL (H)).    No Known Allergies  Antimicrobials this admission: Ceftriaxone 2/18 x 1 Cefepime 2/18 >>  Vancomycin 2/18 >>   Dose adjustments this admission: N/A  Microbiology results: 2/18 BCx: pending 2/19 surgical cx pending  Thank you for allowing pharmacy to be a part of this patients care.  3/19 03/13/2021 9:47 AM

## 2021-03-13 NOTE — Assessment & Plan Note (Addendum)
Hemoglobin 11.0.  Last ferritin seen in care everywhere 165.3.

## 2021-03-13 NOTE — Assessment & Plan Note (Addendum)
Acute kidney injury on chronic kidney disease stage II.  Baseline creatinine 1.43.  Creatinine 2.34 upon admission and down to 1.52 today.

## 2021-03-13 NOTE — TOC CM/SW Note (Signed)
°  Transition of Care [facility]) Screening Note   Patient Details  Name: James [name] Date of Birth: [birth year]   [facility] CM/SW Contact:    Magnus Ivan, LCSW Phone Number: 03/13/2021, 12:16 PM    Transition of Care Department [facility]) has reviewed patient and no TOC needs have been identified at this time. We will continue to monitor patient advancement through interdisciplinary progression rounds. If new patient transition needs arise, please place a TOC consult.

## 2021-03-13 NOTE — Assessment & Plan Note (Addendum)
Patient uses insulin pump at home and was given a small dose of Levemir last night.  Can go back on insulin pump this evening.  Hemoglobin A1c elevated at 9.2.

## 2021-03-14 ENCOUNTER — Encounter: Payer: Self-pay | Admitting: Podiatry

## 2021-03-14 DIAGNOSIS — A419 Sepsis, unspecified organism: Secondary | ICD-10-CM | POA: Diagnosis not present

## 2021-03-14 DIAGNOSIS — M869 Osteomyelitis, unspecified: Secondary | ICD-10-CM | POA: Diagnosis not present

## 2021-03-14 DIAGNOSIS — N179 Acute kidney failure, unspecified: Secondary | ICD-10-CM | POA: Diagnosis not present

## 2021-03-14 DIAGNOSIS — E1065 Type 1 diabetes mellitus with hyperglycemia: Secondary | ICD-10-CM | POA: Diagnosis not present

## 2021-03-14 LAB — CBC
HCT: 34.6 % — ABNORMAL LOW (ref 39.0–52.0)
Hemoglobin: 11 g/dL — ABNORMAL LOW (ref 13.0–17.0)
MCH: 25.7 pg — ABNORMAL LOW (ref 26.0–34.0)
MCHC: 31.8 g/dL (ref 30.0–36.0)
MCV: 80.8 fL (ref 80.0–100.0)
Platelets: 261 10*3/uL (ref 150–400)
RBC: 4.28 MIL/uL (ref 4.22–5.81)
RDW: 12.8 % (ref 11.5–15.5)
WBC: 9.5 10*3/uL (ref 4.0–10.5)
nRBC: 0 % (ref 0.0–0.2)

## 2021-03-14 LAB — BASIC METABOLIC PANEL
Anion gap: 6 (ref 5–15)
BUN: 31 mg/dL — ABNORMAL HIGH (ref 6–20)
CO2: 27 mmol/L (ref 22–32)
Calcium: 8.9 mg/dL (ref 8.9–10.3)
Chloride: 101 mmol/L (ref 98–111)
Creatinine, Ser: 1.52 mg/dL — ABNORMAL HIGH (ref 0.61–1.24)
GFR, Estimated: 59 mL/min — ABNORMAL LOW (ref >60)
Glucose, Bld: 173 mg/dL — ABNORMAL HIGH (ref 70–99)
Potassium: 4.6 mmol/L (ref 3.5–5.1)
Sodium: 134 mmol/L — ABNORMAL LOW (ref 135–145)

## 2021-03-14 LAB — GLUCOSE, CAPILLARY
Glucose-Capillary: 190 mg/dL — ABNORMAL HIGH (ref 70–99)
Glucose-Capillary: 161 mg/dL — ABNORMAL HIGH (ref 70–99)

## 2021-03-14 MED ORDER — INSULIN ASPART 100 UNIT/ML IJ SOLN: INTRAMUSCULAR | 11 refills | Status: DC

## 2021-03-14 MED ORDER — ACETAMINOPHEN 325 MG PO TABS: 650.0000 mg | ORAL_TABLET | Freq: Four times a day (QID) | ORAL | Status: AC | PRN

## 2021-03-14 MED ORDER — DOXYCYCLINE HYCLATE 100 MG PO TABS: 100.0000 mg | ORAL_TABLET | Freq: Two times a day (BID) | ORAL | 0 refills | Status: AC

## 2021-03-14 MED ORDER — DOXYCYCLINE HYCLATE 100 MG PO TABS
100.0000 mg | ORAL_TABLET | Freq: Two times a day (BID) | ORAL | Status: DC
  Administered 2021-03-14: 100 mg via ORAL
  Filled 2021-03-14: qty 1

## 2021-03-14 NOTE — Discharge Summary (Signed)
Physician Discharge Summary   Patient: James [name] MRN: [id number] DOB: November 16, 1980  Admit date:     03/12/2021  Discharge date: 03/14/21  Discharge Physician: Loletha Grayer   PCP: Pcp, No   Recommendations at discharge:   Your medical doctor 5 days Podiatry 1 week  Discharge Diagnoses: Principal Problem:   Sepsis ([place]) Active Problems:   Osteomyelitis of great toe of left foot ([place])   Acute kidney injury superimposed on CKD ([place])   Uncontrolled type 1 diabetes mellitus with hyperglycemia, with long-term current use of insulin (HCC)   Anemia in other chronic diseases classified elsewhere  Hospital Course: Patient was admitted to the hospital on 03/12/2021 and discharged on 03/14/2021.  Patient came in with wound infection first left toe.  Initially started on vancomycin and cefepime.  MRI showing osteomyelitis.  The patient had a first toe amputation by Dr. Posey Pronto podiatry on 03/13/2020.  Patient discharged home on doxycycline.  Wound culture will be followed up by Dr. Posey Pronto and antibiotics adjusted by Dr. Posey Pronto.  The patient was placed on Levemir insulin and sliding scale insulin while here.  He will go back on his insulin pump this evening.  Assessment and Plan: * Sepsis ([place])- (present on admission) Clinical sepsis, present on admission with acute kidney injury, fever, tachycardia and leukocytosis.  Patient started on vancomycin and cefepime.  Patient has osteomyelitis of the left first toe.  Osteomyelitis of great toe of left foot (HCC) Status post amputation of the left first toe by Dr. Posey Pronto this morning.  He will follow-up bone cultures.  From his standpoint can potentially go home tomorrow.  Acute kidney injury superimposed on CKD ([place]) Acute kidney injury on chronic kidney disease stage II.  Baseline creatinine 1.43.  Creatinine 2.34 upon admission and down to 1.78 today.  Uncontrolled type 1 diabetes mellitus with hyperglycemia, with long-term current use of insulin  ([place]) Patient uses insulin pump at home and was given a small dose of Levemir last night.  Potentially can go back on insulin pump this evening.  Hemoglobin A1c elevated at 9.2.  Anemia in other chronic diseases classified elsewhere- (present on admission) Hemoglobin 10.6 today.  Last ferritin seen in care everywhere 165.3.           Consultants: Podiatry Procedures performed: Left first toe amputation Disposition: Home Diet recommendation:  Carb modified diet  DISCHARGE MEDICATION: Allergies as of 03/14/2021   No Known Allergies      Medication List     TAKE these medications    acetaminophen 325 MG tablet Commonly known as: TYLENOL Take 2 tablets (650 mg total) by mouth every 6 (six) hours as needed for mild pain (or Fever >/= 101).   doxycycline 100 MG tablet Commonly known as: VIBRA-TABS Take 1 tablet (100 mg total) by mouth every 12 (twelve) hours for 13 days.   ferrous sulfate 325 (65 FE) MG tablet Take 325 mg by mouth daily with breakfast.   insulin aspart 100 UNIT/ML injection Commonly known as: novoLOG Insulin pump as you have been directed to use What changed:  how much to take how to take this when to take this additional instructions   pantoprazole 40 MG tablet Commonly known as: PROTONIX Take 40 mg by mouth daily.   ranitidine 150 MG tablet Commonly known as: ZANTAC Take 150 mg by mouth daily.        Follow-up Information     Felipa Furnace, DPM Follow up in 1 week(s).   Specialty: Health visitor  information: [address] [place] [address] [phone]         Elmon Kirschner Cidela Follow up in 5 day(s).   Specialty: Family Medicine Contact information: [place] [id number] [phone]                 Discharge Exam: [name] Weights   03/12/21 0945  Weight: 78.9 kg   Physical Exam HENT:     Head: Normocephalic.     Mouth/Throat:     Pharynx: No  oropharyngeal exudate.  Eyes:     General: Lids are normal.     Conjunctiva/sclera: Conjunctivae normal.  Cardiovascular:     Rate and Rhythm: Normal rate and regular rhythm.     Heart sounds: Normal heart sounds, S1 normal and S2 normal.  Pulmonary:     Breath sounds: No decreased breath sounds, wheezing, rhonchi or rales.  Abdominal:     Palpations: Abdomen is soft.     Tenderness: There is no abdominal tenderness.  Musculoskeletal:     Right lower leg: No swelling.     Left lower leg: No swelling.  Skin:    General: Skin is warm.     Comments: Left foot covered with postoperative dressing.  Some blood seen around where the amputation site was with Betadine surrounding.  Neurological:     Mental Status: He is alert and oriented to person, place, and time.     Condition at discharge: Stable  The results of significant diagnostics from this hospitalization (including imaging, microbiology, ancillary and laboratory) are listed below for reference.   Imaging Studies: MR FOOT LEFT WO CONTRAST  Result Date: 03/12/2021 CLINICAL DATA:  Osteomyelitis, foot EXAM: MRI OF THE LEFT FOOT WITHOUT CONTRAST TECHNIQUE: Multiplanar, multisequence MR imaging of the left foot was performed. No intravenous contrast was administered. COMPARISON:  Foot radiograph same day FINDINGS: Bones/Joint/Cartilage There is intense bony edema and confluent low T1 signal within the great toe distal phalanx. There is minimal edema signal within the great toe proximal phalanx with preserved T1 signal. Small great toe interphalangeal joint effusion. No other abnormal marrow signal. Ligaments Intact Lisfranc ligament.  Intact MTP collateral ligaments. Muscles and Tendons Diffuse intramuscular edema in the foot as is commonly seen in diabetics. Soft tissues Diffuse soft tissue swelling of the foot. There is a collection of fluid along the plantar aspect of the great toe measuring 2.2 x 1.0 x 1.7 cm (axial T2 image 14, sagittal  STIR image 11. This abuts the flexor tendon. Great toe soft tissue ulcer with adjacent gas near the tip. IMPRESSION: Osteomyelitis of the great toe distal phalanx with adjacent soft tissue ulcer and soft tissue gas. Reactive marrow edema versus early osteomyelitis of the great toe proximal phalanx. Small great toe IP joint effusion. Fluid collection along the plantar aspect of the great toe at the level of the interphalangeal joint measuring 2.2 x 1.0 x 1.7 cm, possibly an abscess. Electronically Signed   By: Maurine Simmering M.D.   On: 03/12/2021 16:53   DG Foot 2 Views Left  Result Date: 03/13/2021 CLINICAL DATA:  Status post left toe amputation. EXAM: LEFT FOOT - 2 VIEW COMPARISON:  03/12/2021 FINDINGS: Postoperative change from interval first ray amputation at the base of the first proximal phalanx. No complications identified. Overlying soft tissue swelling and bandage material identified. Small foci of gas is noted within the amputation site. IMPRESSION: Postoperative change from interval first ray amputation at the base of the  first proximal phalanx. Electronically Signed   By: Kerby Moors M.D.   On: 03/13/2021 09:02   DG Foot 2 Views Left  Result Date: 03/12/2021 CLINICAL DATA:  Great toe injury. EXAM: LEFT FOOT - 2 VIEW COMPARISON:  None. FINDINGS: Destruction of the tuft of the great toe distal phalanx evident with a moth-eaten appearance in the remaining portion of the phalanx. There appears to be some associated soft tissue gas. Otherwise bony anatomy unremarkable. IMPRESSION: Destruction of the tuft of the great toe distal phalanx compatible with osteomyelitis. There is a moth-eaten appearance of the remaining portion of the distal phalanx compatible also suggestive of osteomyelitis. Electronically Signed   By: Misty Stanley M.D.   On: 03/12/2021 10:15    Microbiology: Results for orders placed or performed during the hospital encounter of 03/12/21  Blood culture (routine x 2)     Status: None  (Preliminary result)   Collection Time: 03/12/21 10:12 AM   Specimen: BLOOD  Result Value Ref Range Status   Specimen Description BLOOD LEFT ANTECUBITAL  Final   Special Requests   Final    BOTTLES DRAWN AEROBIC AND ANAEROBIC Blood Culture adequate volume   Culture   Final    NO GROWTH 2 DAYS Performed at [facility], [address] [place], [place] [address]    Report Status PENDING  Incomplete  Blood culture (routine x 2)     Status: None (Preliminary result)   Collection Time: 03/12/21 10:12 AM   Specimen: BLOOD  Result Value Ref Range Status   Specimen Description BLOOD RIGHT ANTECUBITAL  Final   Special Requests   Final    BOTTLES DRAWN AEROBIC AND ANAEROBIC Blood Culture adequate volume   Culture   Final    NO GROWTH 2 DAYS Performed at [facility], [address] [place], [place] [address]    Report Status PENDING  Incomplete  Resp Panel by RT-PCR (Flu A&B, Covid) Nasopharyngeal Swab     Status: None   Collection Time: 03/12/21 11:43 AM   Specimen: Nasopharyngeal Swab; Nasopharyngeal(NP) swabs in vial transport medium  Result Value Ref Range Status   SARS Coronavirus 2 by RT PCR NEGATIVE NEGATIVE Final    Comment: (NOTE) SARS-CoV-2 target nucleic acids are NOT DETECTED.  The SARS-CoV-2 RNA is generally detectable in upper respiratory specimens during the acute phase of infection. The lowest concentration of SARS-CoV-2 viral copies this assay can detect is 138 copies/mL. A negative result does not preclude SARS-Cov-2 infection and should not be used as the sole basis for treatment or other patient management decisions. A negative result may occur with  improper specimen collection/handling, submission of specimen other than nasopharyngeal swab, presence of viral mutation(s) within the areas targeted by this assay, and inadequate number of viral copies(<138 copies/mL). A negative result must be combined with clinical observations, patient  history, and epidemiological information. The expected result is Negative.  Fact Sheet for Patients:  EntrepreneurPulse.com.au  Fact Sheet for Healthcare Providers:  IncredibleEmployment.be  This test is no t yet approved or cleared by the [place] FDA and  has been authorized for detection and/or diagnosis of SARS-CoV-2 by FDA under an Emergency Use Authorization (EUA). This EUA will remain  in effect (meaning this test can be used) for the duration of the COVID-19 declaration under Section 564(b)(1) of the Act, 21 U.S.C.section 360bbb-3(b)(1), unless the authorization is terminated  or revoked sooner.       Influenza A by PCR NEGATIVE NEGATIVE Final  Influenza B by PCR NEGATIVE NEGATIVE Final    Comment: (NOTE) The Xpert Xpress SARS-CoV-2/FLU/RSV plus assay is intended as an aid in the diagnosis of influenza from Nasopharyngeal swab specimens and should not be used as a sole basis for treatment. Nasal washings and aspirates are unacceptable for Xpert Xpress SARS-CoV-2/FLU/RSV testing.  Fact Sheet for Patients: EntrepreneurPulse.com.au  Fact Sheet for Healthcare Providers: IncredibleEmployment.be  This test is not yet approved or cleared by the [place] FDA and has been authorized for detection and/or diagnosis of SARS-CoV-2 by FDA under an Emergency Use Authorization (EUA). This EUA will remain in effect (meaning this test can be used) for the duration of the COVID-19 declaration under Section 564(b)(1) of the Act, 21 U.S.C. section 360bbb-3(b)(1), unless the authorization is terminated or revoked.  Performed at [facility], [address] [place], [place] [address]   Aerobic/Anaerobic Culture w Gram Stain (surgical/deep wound)     Status: None (Preliminary result)   Collection Time: 03/13/21  8:03 AM   Specimen: PATH Other; Tissue  Result Value Ref Range Status   Specimen  Description   Final    WOUND Performed at [facility], [place] [address] [place], [place] [address]    Special Requests   Final    NONE Performed at [facility], [place], [place] [address]    Gram Stain   Final    RARE WBC PRESENT,BOTH PMN AND MONONUCLEAR RARE GRAM POSITIVE COCCI Performed at [facility], [place] [address] [place],  [address]    Culture PENDING  Incomplete   Report Status PENDING  Incomplete    Labs: CBC: Recent Labs  Lab 03/12/21 1012 03/13/21 0411 03/14/21 0615  WBC 15.4* 12.4* 9.5  HGB 11.0* 10.6* 11.0*  HCT 35.7* 34.1* 34.6*  MCV 80.8 79.9* 80.8  PLT 266 264 0000000   Basic Metabolic Panel: Recent Labs  Lab 03/12/21 1012 03/13/21 0411 03/14/21 0615  NA 132* 134* 134*  K 4.9 4.6 4.6  CL 99 101 101  CO2 23 26 27   GLUCOSE 345* 225* 173*  BUN 45* 41* 31*  CREATININE 2.34* 1.78* 1.52*  CALCIUM 8.9 8.9 8.9   Liver Function Tests: Recent Labs  Lab 03/12/21 1012  AST 12*  ALT 13  ALKPHOS 93  BILITOT 0.8  PROT 8.2*  ALBUMIN 3.3*   CBG: Recent Labs  Lab 03/13/21 0834 03/13/21 0908 03/13/21 1235 03/13/21 1619 03/13/21 2132  GLUCAP 175* 176* 283* 305* 283*    Discharge time spent: Serious 30 minutes.  Signed: Loletha Grayer, MD [facility] 03/14/2021

## 2021-03-15 LAB — SURGICAL PATHOLOGY

## 2021-03-15 NOTE — Progress Notes (Signed)
Culture results came back of his toe growing Streptococcus agalactiae and Citrobacter.  Case discussed with infectious disease pharmacist and recommended changing antibiotics to Keflex.  I called the patient's pharmacist and told them to discontinue the doxycycline and I prescribed Keflex 500 mg 3 times a day for 12 days.  I called the patient and left a message that I needed to change antibiotics.  Left a message I called the antibiotic into the pharmacy.  Dr Alford Highland

## 2021-03-17 LAB — CULTURE, BLOOD (ROUTINE X 2)
Culture: NO GROWTH
Culture: NO GROWTH
Special Requests: ADEQUATE
Special Requests: ADEQUATE

## 2021-03-18 LAB — AEROBIC/ANAEROBIC CULTURE W GRAM STAIN (SURGICAL/DEEP WOUND)

## 2021-03-22 ENCOUNTER — Other Ambulatory Visit: Payer: Self-pay

## 2021-03-22 ENCOUNTER — Ambulatory Visit (INDEPENDENT_AMBULATORY_CARE_PROVIDER_SITE_OTHER): Payer: No Typology Code available for payment source | Admitting: Podiatry

## 2021-03-22 ENCOUNTER — Ambulatory Visit: Payer: Managed Care, Other (non HMO)

## 2021-03-22 ENCOUNTER — Encounter: Payer: Self-pay | Admitting: *Deleted

## 2021-03-22 DIAGNOSIS — Z89412 Acquired absence of left great toe: Secondary | ICD-10-CM

## 2021-03-22 NOTE — Progress Notes (Signed)
°  Subjective:  Patient ID: James [name], male    DOB: 1980-07-07,  MRN: [id number]  Chief Complaint  Patient presents with   Routine Post Op    NP/ POV#1 03/13/2021 AMPUTATION HALLUX LT @ INPATIENT [facility] / REQ    DOS: 03/13/2021 Procedure: Left hallux amputation partial  42 y.o. male returns for post-op check.  Patient states is doing well.  Minimal pain.  He states been taking it easy.  He needs a work note.  Bandages clean dry and intact  Review of Systems: Negative except as noted in the HPI. Denies N/V/F/Ch.  Past Medical History:  Diagnosis Date   Diabetes mellitus without complication (HCC)     Current Outpatient Medications:    acetaminophen (TYLENOL) 325 MG tablet, Take 2 tablets (650 mg total) by mouth every 6 (six) hours as needed for mild pain (or Fever >/= 101)., Disp: , Rfl:    doxycycline (VIBRA-TABS) 100 MG tablet, Take 1 tablet (100 mg total) by mouth every 12 (twelve) hours for 13 days., Disp: 26 tablet, Rfl: 0   ferrous sulfate 325 (65 FE) MG tablet, Take 325 mg by mouth daily with breakfast., Disp: , Rfl:    insulin aspart (NOVOLOG) 100 UNIT/ML injection, Insulin pump as you have been directed to use, Disp: 10 mL, Rfl: 11   pantoprazole (PROTONIX) 40 MG tablet, Take 40 mg by mouth daily., Disp: , Rfl:    ranitidine (ZANTAC) 150 MG tablet, Take 150 mg by mouth daily., Disp: , Rfl:   Social History   Tobacco Use  Smoking Status Never  Smokeless Tobacco Never    No Known Allergies Objective:  There were no vitals filed for this visit. There is no height or weight on file to calculate BMI. Constitutional Well developed. Well nourished.  Vascular Foot warm and well perfused. Capillary refill normal to all digits.   Neurologic Normal speech. Oriented to person, place, and time. Epicritic sensation to light touch grossly present bilaterally.  Dermatologic Skin healing well without signs of infection. Skin edges well coapted without signs of infection.   Orthopedic: Tenderness to palpation noted about the surgical site.   Radiographs: None Assessment:   1. History of amputation of left great toe [facility])    Plan:  Patient was evaluated and treated and all questions answered.  S/p foot surgery left -Progressing as expected post-operatively. -XR: None -WB Status: Weightbearing as tolerated in surgical shoe -Sutures: Intact.  No clinical signs of Deis is noted.  No complication noted. -Medications: None -Foot redressed.  No follow-ups on file.

## 2021-03-23 DIAGNOSIS — 419620001 Death: Secondary | SNOMED CT | POA: Diagnosis not present

## 2021-03-23 DIAGNOSIS — Z419 Encounter for procedure for purposes other than remedying health state, unspecified: Secondary | ICD-10-CM | POA: Diagnosis not present

## 2021-03-23 DEATH — deceased

## 2021-04-05 ENCOUNTER — Ambulatory Visit (INDEPENDENT_AMBULATORY_CARE_PROVIDER_SITE_OTHER): Payer: Managed Care, Other (non HMO) | Admitting: Podiatry

## 2021-04-05 ENCOUNTER — Other Ambulatory Visit: Payer: Self-pay

## 2021-04-05 DIAGNOSIS — Z89412 Acquired absence of left great toe: Secondary | ICD-10-CM

## 2021-04-05 NOTE — Progress Notes (Signed)
?  Subjective:  ?Patient ID: James [name], male    DOB: 08/21/1980,  MRN: [id number] ? ?Chief Complaint  ?Patient presents with  ? Routine Post Op  ?  POV #2 DOS 03/13/2021 AMPUTATION HALLUX LT  ? ? ?DOS: 03/13/2021 ?Procedure: Left hallux amputation partial ? ?41 y.o. male returns for post-op check.  Patient states is doing well.  Minimal pain.  He states been taking it easy.  He needs a work note.  Bandages clean dry and intact ? ?Review of Systems: Negative except as noted in the HPI. Denies N/V/F/Ch. ? ?Past Medical History:  ?Diagnosis Date  ? Diabetes mellitus without complication ([place])   ? ? ?Current Outpatient Medications:  ?  acetaminophen (TYLENOL) 325 MG tablet, Take 2 tablets (650 mg total) by mouth every 6 (six) hours as needed for mild pain (or Fever >/= 101)., Disp: , Rfl:  ?  ferrous sulfate 325 (65 FE) MG tablet, Take 325 mg by mouth daily with breakfast., Disp: , Rfl:  ?  insulin aspart (NOVOLOG) 100 UNIT/ML injection, Insulin pump as you have been directed to use, Disp: 10 mL, Rfl: 11 ?  pantoprazole (PROTONIX) 40 MG tablet, Take 40 mg by mouth daily., Disp: , Rfl:  ?  ranitidine (ZANTAC) 150 MG tablet, Take 150 mg by mouth daily., Disp: , Rfl:  ? ?Social History  ? ?Tobacco Use  ?Smoking Status Never  ?Smokeless Tobacco Never  ? ? ?No Known Allergies ?Objective:  ?There were no vitals filed for this visit. ?There is no height or weight on file to calculate BMI. ?Constitutional Well developed. ?Well nourished.  ?Vascular Foot warm and well perfused. ?Capillary refill normal to all digits.   ?Neurologic Normal speech. ?Oriented to person, place, and time. ?Epicritic sensation to light touch grossly present bilaterally.  ?Dermatologic Skin has mostly epithelialized.  Superficial Deis is noted measurements noted below.  No clinical signs of infection noted.  ?Orthopedic: No tenderness to palpation noted about the surgical site.  ? ?Radiographs: None ?Assessment:  ? ?1. History of amputation of left  great toe ([place])   ? ? ?Plan:  ?Patient was evaluated and treated and all questions answered. ? ?S/p foot surgery left ?-Progressing as expected post-operatively. ?-XR: None ?-WB Status: Weightbearing as tolerated in surgical shoe ?-Sutures: Removed no clinical signs of Deis is noted.  Superficial dehiscence noted measuring 1.5 cm x 0.4 cm x 0.1 cm.  Asked the patient to apply Betadine wet-to-dry dressing he states understanding will do so ?-Medications: None ?-Foot redressed. ? ?No follow-ups on file.  ?

## 2021-04-06 ENCOUNTER — Telehealth: Payer: Self-pay | Admitting: Podiatry

## 2021-04-06 ENCOUNTER — Encounter: Payer: Self-pay | Admitting: Podiatry

## 2021-04-06 NOTE — Telephone Encounter (Signed)
Wrote patient a note for work and faxed to work per pt request ?

## 2021-04-06 NOTE — Telephone Encounter (Signed)
Patient called asking for a work note to be out until his next appt. Which is in 3 weeks. ?

## 2021-04-22 ENCOUNTER — Telehealth: Payer: Self-pay | Admitting: *Deleted

## 2021-04-22 NOTE — Telephone Encounter (Signed)
I called and informed Mr. Keeven that I faxed his medical records.  I informed him that he may want to contact [facility]'s Billing Department regarding the Worker's Comp.  I told him it looks like everything is being sent to his insurance.  I told him I would let Chip Boer at our [place] billing office know to re-file the claims with Worker's Comp. ?

## 2021-04-22 NOTE — Telephone Encounter (Signed)
"  Have you sent off my medical records for my Worker's Comp?"  I haven't received any forms for you from anyone requesting medical records at this time.  We don't have you listed as Worker's Comp. You didn't inform us that it was Worker's Comp when you came in for your appointment.  We've been filing everything to your insurance.  "I spoke to someone yesterday and she said she would fax them to [employer]."  Do you know who you spoke to?  "I don't know her name.  I haven't received a check in 41 days.  I need to have the notes sent as soon as possible."  I see where a medical records request was sent to [facility].  Maybe you spoke to someone from there.  I will print that request and fax the information to [facility]. ? ?I faxed the notes to Luna Fuse with [facility] Claims Management. ? ? ? ? ?

## 2021-04-23 DIAGNOSIS — Z419 Encounter for procedure for purposes other than remedying health state, unspecified: Secondary | ICD-10-CM | POA: Diagnosis not present

## 2021-04-26 ENCOUNTER — Ambulatory Visit (INDEPENDENT_AMBULATORY_CARE_PROVIDER_SITE_OTHER): Payer: No Typology Code available for payment source | Admitting: Podiatry

## 2021-04-26 ENCOUNTER — Encounter: Payer: Self-pay | Admitting: *Deleted

## 2021-04-26 DIAGNOSIS — Z89412 Acquired absence of left great toe: Secondary | ICD-10-CM | POA: Diagnosis not present

## 2021-04-26 NOTE — Progress Notes (Signed)
?  Subjective:  ?Patient ID: James [name], male    DOB: 1980/08/02,  MRN: [id number] ? ?Chief Complaint  ?Patient presents with  ? Routine Post Op  ?  DOS 2.19.23  ? ? ?DOS: 03/13/2021 ?Procedure: Left hallux amputation partial ? ?41 y.o. male returns for post-op check.  Patient states is doing well.  Minimal pain.  He states been taking it easy.  He needs a work note.  Bandages clean dry and intact ? ?Review of Systems: Negative except as noted in the HPI. Denies N/V/F/Ch. ? ?Past Medical History:  ?Diagnosis Date  ? Diabetes mellitus without complication (HCC)   ? ? ?Current Outpatient Medications:  ?  acetaminophen (TYLENOL) 325 MG tablet, Take 2 tablets (650 mg total) by mouth every 6 (six) hours as needed for mild pain (or Fever >/= 101)., Disp: , Rfl:  ?  ferrous sulfate 325 (65 FE) MG tablet, Take 325 mg by mouth daily with breakfast., Disp: , Rfl:  ?  insulin aspart (NOVOLOG) 100 UNIT/ML injection, Insulin pump as you have been directed to use, Disp: 10 mL, Rfl: 11 ?  pantoprazole (PROTONIX) 40 MG tablet, Take 40 mg by mouth daily., Disp: , Rfl:  ?  ranitidine (ZANTAC) 150 MG tablet, Take 150 mg by mouth daily., Disp: , Rfl:  ? ?Social History  ? ?Tobacco Use  ?Smoking Status Never  ?Smokeless Tobacco Never  ? ? ?No Known Allergies ?Objective:  ?There were no vitals filed for this visit. ?There is no height or weight on file to calculate BMI. ?Constitutional Well developed. ?Well nourished.  ?Vascular Foot warm and well perfused. ?Capillary refill normal to all digits.   ?Neurologic Normal speech. ?Oriented to person, place, and time. ?Epicritic sensation to light touch grossly present bilaterally.  ?Dermatologic Skin completely epithelialized.  No signs of dehiscence noted.  No complication noted.  ?Orthopedic: No tenderness to palpation noted about the surgical site.  ? ?Radiographs: None ?Assessment:  ? ?1. History of amputation of left great toe (HCC)   ? ? ? ?Plan:  ?Patient was evaluated and treated and  all questions answered. ? ?S/p foot surgery left ?-Clinically healed and the wound has completely epithelialized.  At this time patient can return to regular activities without any reservation.  I encouraged him to keep an eye out on the toe and if any foot and ankle issues arise future of asked him to come back and see me.  He states understanding. ? ?No follow-ups on file.  ?

## 2021-04-27 ENCOUNTER — Telehealth: Payer: Self-pay | Admitting: *Deleted

## 2021-04-27 NOTE — Telephone Encounter (Signed)
"  I need to get the clinical note from his visit yesterday and I need to know the date of his next appointment."  He has been discharged, no follow-up is needed.  I will send you the clinical note.  What is your fax number?  "It is [phone] ." ? ?I faxed the notes to Ms. Cartwright. ? ? ?

## 2021-05-03 ENCOUNTER — Ambulatory Visit (INDEPENDENT_AMBULATORY_CARE_PROVIDER_SITE_OTHER): Payer: Managed Care, Other (non HMO) | Admitting: Podiatry

## 2021-05-03 DIAGNOSIS — Z89412 Acquired absence of left great toe: Secondary | ICD-10-CM

## 2021-05-03 NOTE — Progress Notes (Signed)
?  Subjective:  ?Patient ID: James [name], male    DOB: 11/12/1980,  MRN: [id number] ? ?Chief Complaint  ?Patient presents with  ? Callouses  ?  Callus trim  ? ? ?DOS: 03/13/2021 ?Procedure: Left hallux amputation partial ? ?41 y.o. male returns for post-op check.  Patient states is doing well.  He has been doing great at work.  He has been using his foot in regular shoes without any acute complaints. ? ?Review of Systems: Negative except as noted in the HPI. Denies N/V/F/Ch. ? ?Past Medical History:  ?Diagnosis Date  ? Diabetes mellitus without complication (HCC)   ? ? ?Current Outpatient Medications:  ?  acetaminophen (TYLENOL) 325 MG tablet, Take 2 tablets (650 mg total) by mouth every 6 (six) hours as needed for mild pain (or Fever >/= 101)., Disp: , Rfl:  ?  ferrous sulfate 325 (65 FE) MG tablet, Take 325 mg by mouth daily with breakfast., Disp: , Rfl:  ?  insulin aspart (NOVOLOG) 100 UNIT/ML injection, Insulin pump as you have been directed to use, Disp: 10 mL, Rfl: 11 ?  pantoprazole (PROTONIX) 40 MG tablet, Take 40 mg by mouth daily., Disp: , Rfl:  ?  ranitidine (ZANTAC) 150 MG tablet, Take 150 mg by mouth daily., Disp: , Rfl:  ? ?Social History  ? ?Tobacco Use  ?Smoking Status Never  ?Smokeless Tobacco Never  ? ? ?No Known Allergies ?Objective:  ?There were no vitals filed for this visit. ?There is no height or weight on file to calculate BMI. ?Constitutional Well developed. ?Well nourished.  ?Vascular Foot warm and well perfused. ?Capillary refill normal to all digits.   ?Neurologic Normal speech. ?Oriented to person, place, and time. ?Epicritic sensation to light touch grossly present bilaterally.  ?Dermatologic Skin completely epithelialized.  No signs of dehiscence noted.  No complication noted.  ?Orthopedic: No tenderness to palpation noted about the surgical site.  ? ?Radiographs: None ?Assessment:  ? ?No diagnosis found. ? ? ? ?Plan:  ?Patient was evaluated and treated and all questions  answered. ? ?S/p foot surgery left ?-Clinically healed and the wound has completely epithelialized.  At this time patient can return to regular activities without any reservation.  I encouraged him to keep an eye out on the toe and if any foot and ankle issues arise future of asked him to come back and see me.  He states understanding. ? ?No follow-ups on file.  ?

## 2021-05-23 DIAGNOSIS — Z419 Encounter for procedure for purposes other than remedying health state, unspecified: Secondary | ICD-10-CM | POA: Diagnosis not present

## 2021-06-23 DIAGNOSIS — Z419 Encounter for procedure for purposes other than remedying health state, unspecified: Secondary | ICD-10-CM | POA: Diagnosis not present

## 2021-07-15 DIAGNOSIS — Z9621 Cochlear implant status: Secondary | ICD-10-CM | POA: Diagnosis not present

## 2021-07-15 DIAGNOSIS — H903 Sensorineural hearing loss, bilateral: Secondary | ICD-10-CM | POA: Diagnosis not present

## 2021-07-15 DIAGNOSIS — Z461 Encounter for fitting and adjustment of hearing aid: Secondary | ICD-10-CM | POA: Diagnosis not present

## 2021-07-23 DIAGNOSIS — Z419 Encounter for procedure for purposes other than remedying health state, unspecified: Secondary | ICD-10-CM | POA: Diagnosis not present

## 2021-08-18 ENCOUNTER — Ambulatory Visit: Payer: Managed Care, Other (non HMO) | Admitting: Podiatry

## 2021-08-23 DIAGNOSIS — Z419 Encounter for procedure for purposes other than remedying health state, unspecified: Secondary | ICD-10-CM | POA: Diagnosis not present

## 2021-09-23 DIAGNOSIS — Z419 Encounter for procedure for purposes other than remedying health state, unspecified: Secondary | ICD-10-CM | POA: Diagnosis not present

## 2021-10-19 NOTE — Progress Notes (Addendum)
[facility] Note  10/20/2021     CHIEF COMPLAINT Patient presents for Retina Evaluation   HISTORY OF PRESENT ILLNESS: James [name] is a 41 y.o. male who presents to the clinic today for:   HPI     Retina Evaluation   In right eye.  This started 2 weeks ago.  Associated Symptoms Negative for Flashes and Floaters.  Context:  distance vision, mid-range vision, near vision and reading.  I, the attending physician,  performed the HPI with the patient and updated documentation appropriately.        Comments   Patient is here today based on a referral from Dr. Joya San for possible retinal hemorrhage in the right eye. He is complaining of blurry vision for about 2 weeks. His blood sugar was 187 and he is unsure of his A1C.       Last edited by Bernarda Caffey, MD on 10/20/2021 11:56 PM.    Pt is here on the referral of Dr. Gwynn Burly for concern of retinal hemorrhage OD, pt has had laser at an office in [place], [place], he denies having injections, he states 2 weeks ago he started seeing a "hair" in his vision and then his vision overall became blurry, he states he has never had the blurriness before, but has seen the hair in his vision previously, his last A1c was 9.2 on 02.18.23, pt states his blood sugar is well controlled  Referring physician: [place],  [place] [id number]  HISTORICAL INFORMATION:   Selected notes from the MEDICAL RECORD NUMBER Referred by Dr. Gwynn Burly for vitreous hemorrhage LEE:  Ocular Hx- PMH-    CURRENT MEDICATIONS: No current outpatient medications on file. (Ophthalmic Drugs)   No current facility-administered medications for this visit. (Ophthalmic Drugs)   Current Outpatient Medications (Other)  Medication Sig   acetaminophen (TYLENOL) 325 MG tablet Take 2 tablets (650 mg total) by mouth every 6 (six) hours as needed for mild pain (or Fever >/= 101).   ferrous sulfate  325 (65 FE) MG tablet Take 325 mg by mouth daily with breakfast.   insulin aspart (NOVOLOG) 100 UNIT/ML injection Insulin pump as you have been directed to use   pantoprazole (PROTONIX) 40 MG tablet Take 40 mg by mouth daily.   ranitidine (ZANTAC) 150 MG tablet Take 150 mg by mouth daily.   No current facility-administered medications for this visit. (Other)   REVIEW OF SYSTEMS: ROS   Positive for: Endocrine, Eyes Last edited by Annie Paras, COT on 10/20/2021  8:54 AM.     ALLERGIES No Known Allergies  PAST MEDICAL HISTORY Past Medical History:  Diagnosis Date   Diabetes mellitus without complication [facility])    Past Surgical History:  Procedure Laterality Date   AMPUTATION TOE Left 03/13/2021   Procedure: AMPUTATION BIG TOE;  Surgeon: Felipa Furnace, DPM;  Location: [facility] ORS;  Service: Podiatry;  Laterality: Left;   COCHLEAR IMPLANT  05/27/2020   [facility]   FAMILY HISTORY Family History  Problem Relation Age of Onset   Diabetes Father    SOCIAL HISTORY Social History   Tobacco Use   Smoking status: Never   Smokeless tobacco: Never  Vaping Use   Vaping Use: Never used  Substance Use Topics   Alcohol use: Not Currently   Drug use: Not Currently       OPHTHALMIC EXAM:  Base Eye Exam     Visual Acuity (Snellen -  Linear)       Right Left   Dist cc 20/30 +2 20/20   Dist ph cc NI     Correction: Glasses         Tonometry (Tonopen, 9:01 AM)       Right Left   Pressure 15 17         Pupils       Dark Light Shape React APD   Right 2 2 Round NR None   Left 2 2 Round NR None         Visual Fields       Left Right    Full Full         Extraocular Movement       Right Left    Full, Ortho Full, Ortho         Neuro/Psych     Oriented x3: Yes         Dilation     Both eyes: 1.0% Mydriacyl, 2.5% Phenylephrine @ 8:55 AM           Slit Lamp and Fundus Exam     Slit Lamp Exam       Right Left   Lids/Lashes Dermatochalasis  - upper lid, Meibomian gland dysfunction Dermatochalasis - upper lid, Meibomian gland dysfunction   Conjunctiva/Sclera White and quiet White and quiet   Cornea trace endo pigment Clear   Anterior Chamber deep and clear deep and clear   Iris Round and dilated, No NVI Round and dilated, No NVI   Lens 1-2+ Cortical cataract trace cortical changes   Anterior Vitreous Vitreous syneresis, +RBCs, blood stained vitreous condensations, focal pre-retinal heme along distal ST arcades, mild fibrosis along ST arcades Vitreous syneresis, +RBCs         Fundus Exam       Right Left   Disc hazy view, Sharp rim, +heme Pink and Sharp, no NVD   C/D Ratio 0.2 0.6   Macula hazy view, grossly flat, +fibrosis, +PRF Flat, Good foveal reflex, scattered MA / DBH greatest temporal macula, focal NVE just inside IT arcades   Vessels attenuated, Tortuous, +NV, +fibrosis attenuated, Tortuous, focal NV just inside IT arcades   Periphery Attached, scattered 360 PRP with room for fill in, scattered DBH, WWP temporally Attached, scattered MA / DBH, 360 PRP room for fill in temporally, WWP temporally and nasally            IMAGING AND PROCEDURES  Imaging and Procedures for 10/20/2021  OCT, Retina - OU - Both Eyes       Right Eye Quality was good. Central Foveal Thickness: 277. Progression has no prior data. Findings include normal foveal contour, no IRF, no SRF, epiretinal membrane, macular pucker, preretinal fibrosis (PRF inferior and temporal midzone: +vit opacities).   Left Eye Quality was good. Central Foveal Thickness: 260. Progression has no prior data. Findings include normal foveal contour, no IRF, no SRF, vitreomacular adhesion .   Notes *Images captured and stored on drive  Diagnosis / Impression:  OD: PRF inferior and temporal midzone; +vitreous opacities OS: NFP, no IRF / SRF  Clinical management:  See below  Abbreviations: NFP - Normal foveal profile. CME - cystoid macular edema. PED - pigment  epithelial detachment. IRF - intraretinal fluid. SRF - subretinal fluid. EZ - ellipsoid zone. ERM - epiretinal membrane. ORA - outer retinal atrophy. ORT - outer retinal tubulation. SRHM - subretinal hyper-reflective material. IRHM - intraretinal hyper-reflective material      Fluorescein Angiography  Optos (Transit OD)       Right Eye Progression has no prior data. Early phase findings include blockage, leakage, staining, microaneurysm, neovascularization disc, retinal neovascularization, vascular perfusion defect. Mid/Late phase findings include blockage, leakage, staining, microaneurysm, neovascularization disc, retinal neovascularization, vascular perfusion defect (Focal blockage along distal IT arcades corresponding to pre-retinal heme, scattered patches of NVE 360, mild NVD).   Left Eye Progression has no prior data. Early phase findings include leakage, staining, microaneurysm, retinal neovascularization, vascular perfusion defect. Mid/Late phase findings include leakage, staining, microaneurysm, retinal neovascularization, vascular perfusion defect (Focal patch of MA, vascular non-perfusion and NVE temporal macula).   Notes **Images stored on drive**  Impression: PDR OU OD: Focal blockage along distal IT arcades corresponding to pre-retinal heme, scattered patches of NVE 360, mild NVD OS: Focal patch of MA, vascular non-perfusion and NVE temporal macula            ASSESSMENT/PLAN:    ICD-10-CM   1. 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 Vitreous hemorrhage of right eye (HCC)  H43.11     3. Essential hypertension  I10     4. Hypertensive retinopathy of both eyes  H35.033 Fluorescein Angiography Optos (Transit OD)    CANCELED: Color Fundus Photography Optos - OU - Both Eyes     1,2. Proliferative  diabetic retinopathy w/o DME, OU (OD > OS) + diabetic VH OD  - pt previously followed by retina specialist in [place], [place] -- moved to [place] area recently  - history of PRP OU  - pt presents with 2 wk history of decreased vision OD -- saw Dr. Santiago Bumpers who then referred here. - The incidence, risk factors for progression, natural history and treatment options for diabetic retinopathy were discussed with patient.   - The need for close monitoring of blood glucose, blood pressure, and serum lipids, avoiding cigarette or any type of tobacco, and the need for long term follow up was also discussed with patient. - exam shows mild diffuse VH OD; PRP OU - BCVA 20/30 OD, 20/20 OS - FA today (09.28.23) shows OD: Focal blockage along distal IT arcades corresponding to pre-retinal heme, scattered patches of NVE 360, mild NVD; OS: Focal patch of MA, vascular non-perfusion and NVE temporal macula -- pt would likely benefit from some fill in PRP - OCT without diabetic macular edema, both eyes  - discussed findings and prognosis - recommend IVA OU for active NV, but pts insurance requires prior auth, will bring pt back next Tuesday - f/u Tuesday -- OCT, IVA - VH precautions reviewed -- minimize activities, keep head elevated, avoid ASA/NSAIDs/blood thinners as able  3,4. Hypertensive retinopathy OU - discussed importance of tight BP control - monitor  Ophthalmic Meds Ordered this visit:  No orders of the defined types were placed in this encounter.    Return in about 5 days (around 10/25/2021) for PDR OU w/ VH OD -- OCT, Possible Injxn.  There are no Patient Instructions on file for this visit.   Explained the diagnoses, plan, and follow up with the patient and they expressed understanding.  Patient expressed understanding of the importance of proper follow up care.   This document serves as a record of services personally performed by Karie Chimera, MD, PhD. It was created on their behalf by Glee Arvin. Manson Passey, OA  an ophthalmic technician. The creation of this record is the provider's dictation and/or activities during the visit.    Electronically signed by: Glee Arvin. Manson Passey, [place] 09.27.2023 12:04 AM  Karie Chimera, M.D., Ph.D. Diseases & Surgery of the Retina and Vitreous [facility]  I have reviewed the above documentation for accuracy and completeness, and I agree with the above. Karie Chimera, M.D., Ph.D. 10/21/21 12:04 AM   Abbreviations: M myopia (nearsighted); A astigmatism; H hyperopia (farsighted); P presbyopia; Mrx spectacle prescription;  CTL contact lenses; OD right eye; OS left eye; OU both eyes  XT exotropia; ET esotropia; PEK punctate epithelial keratitis; PEE punctate epithelial erosions; DES dry eye syndrome; MGD meibomian gland dysfunction; ATs artificial tears; PFAT's preservative free artificial tears; NSC nuclear sclerotic cataract; PSC posterior subcapsular cataract; ERM epi-retinal membrane; PVD posterior vitreous detachment; RD retinal detachment; DM diabetes mellitus; DR diabetic retinopathy; NPDR non-proliferative diabetic retinopathy; PDR proliferative diabetic retinopathy; CSME clinically significant macular edema; DME diabetic macular edema; dbh dot blot hemorrhages; CWS cotton wool spot; POAG primary open angle glaucoma; C/D cup-to-disc ratio; HVF humphrey visual field; GVF goldmann visual field; OCT optical coherence tomography; IOP intraocular pressure; BRVO Branch retinal vein occlusion; CRVO central retinal vein occlusion; CRAO central retinal artery occlusion; BRAO branch retinal artery occlusion; RT retinal tear; SB scleral buckle; PPV pars plana vitrectomy; VH Vitreous hemorrhage; PRP panretinal laser photocoagulation; IVK intravitreal kenalog; VMT vitreomacular traction; MH Macular hole;  NVD neovascularization of the disc; NVE neovascularization elsewhere; AREDS age related eye disease study; ARMD age related macular degeneration; POAG  primary open angle glaucoma; EBMD epithelial/anterior basement membrane dystrophy; ACIOL anterior chamber intraocular lens; IOL intraocular lens; PCIOL posterior chamber intraocular lens; Phaco/IOL phacoemulsification with intraocular lens placement; PRK photorefractive keratectomy; LASIK laser assisted in situ keratomileusis; HTN hypertension; DM diabetes mellitus; COPD chronic obstructive pulmonary disease

## 2021-10-20 ENCOUNTER — Ambulatory Visit (INDEPENDENT_AMBULATORY_CARE_PROVIDER_SITE_OTHER): Payer: Managed Care, Other (non HMO) | Admitting: Ophthalmology

## 2021-10-20 ENCOUNTER — Encounter (INDEPENDENT_AMBULATORY_CARE_PROVIDER_SITE_OTHER): Payer: Self-pay | Admitting: Ophthalmology

## 2021-10-20 VITALS — BP 123/83

## 2021-10-20 DIAGNOSIS — H3581 Retinal edema: Secondary | ICD-10-CM

## 2021-10-20 DIAGNOSIS — E113593 Type 2 diabetes mellitus with proliferative diabetic retinopathy without macular edema, bilateral: Secondary | ICD-10-CM | POA: Diagnosis not present

## 2021-10-20 DIAGNOSIS — H4311 Vitreous hemorrhage, right eye: Secondary | ICD-10-CM | POA: Diagnosis not present

## 2021-10-20 DIAGNOSIS — H35033 Hypertensive retinopathy, bilateral: Secondary | ICD-10-CM | POA: Diagnosis not present

## 2021-10-20 DIAGNOSIS — I10 Essential (primary) hypertension: Secondary | ICD-10-CM | POA: Diagnosis not present

## 2021-10-23 DIAGNOSIS — Z419 Encounter for procedure for purposes other than remedying health state, unspecified: Secondary | ICD-10-CM | POA: Diagnosis not present

## 2021-10-25 ENCOUNTER — Ambulatory Visit (INDEPENDENT_AMBULATORY_CARE_PROVIDER_SITE_OTHER): Payer: Managed Care, Other (non HMO) | Admitting: Ophthalmology

## 2021-10-25 ENCOUNTER — Encounter (INDEPENDENT_AMBULATORY_CARE_PROVIDER_SITE_OTHER): Payer: Self-pay | Admitting: Ophthalmology

## 2021-10-25 DIAGNOSIS — E113593 Type 2 diabetes mellitus with proliferative diabetic retinopathy without macular edema, bilateral: Secondary | ICD-10-CM

## 2021-10-25 DIAGNOSIS — I10 Essential (primary) hypertension: Secondary | ICD-10-CM

## 2021-10-25 DIAGNOSIS — H4311 Vitreous hemorrhage, right eye: Secondary | ICD-10-CM | POA: Diagnosis not present

## 2021-10-25 DIAGNOSIS — H35033 Hypertensive retinopathy, bilateral: Secondary | ICD-10-CM | POA: Diagnosis not present

## 2021-10-25 MED ORDER — BEVACIZUMAB CHEMO INJECTION 1.25MG/0.05ML SYRINGE FOR KALEIDOSCOPE
1.2500 mg | INTRAVITREAL | Status: AC | PRN
  Administered 2021-10-25: 1.25 mg via INTRAVITREAL

## 2021-10-25 NOTE — Progress Notes (Signed)
[facility] Note  10/25/2021     CHIEF COMPLAINT Patient presents for Retina Follow Up   HISTORY OF PRESENT ILLNESS: James [name] is a 41 y.o. male who presents to the clinic today for:   HPI     Retina Follow Up   Patient presents with  Diabetic Retinopathy.  In both eyes.  This started 5 days ago.  I, the attending physician,  performed the HPI with the patient and updated documentation appropriately.        Comments   Patient here for 5 days retina follow up for PDR OU. Patient states vision about the same. No eye pain.       Last edited by Bernarda Caffey, MD on 10/25/2021 11:46 AM.    Pt is here for IVA OU. Pt reports no change in vision.  Referring physician: No referring provider defined for this encounter.  HISTORICAL INFORMATION:   Selected notes from the MEDICAL RECORD NUMBER Referred by Dr. Gwynn Burly for vitreous hemorrhage LEE:  Ocular Hx- PMH-    CURRENT MEDICATIONS: No current outpatient medications on file. (Ophthalmic Drugs)   No current facility-administered medications for this visit. (Ophthalmic Drugs)   Current Outpatient Medications (Other)  Medication Sig   acetaminophen (TYLENOL) 325 MG tablet Take 2 tablets (650 mg total) by mouth every 6 (six) hours as needed for mild pain (or Fever >/= 101).   ferrous sulfate 325 (65 FE) MG tablet Take 325 mg by mouth daily with breakfast.   insulin aspart (NOVOLOG) 100 UNIT/ML injection Insulin pump as you have been directed to use   pantoprazole (PROTONIX) 40 MG tablet Take 40 mg by mouth daily.   ranitidine (ZANTAC) 150 MG tablet Take 150 mg by mouth daily.   No current facility-administered medications for this visit. (Other)   REVIEW OF SYSTEMS: ROS   Positive for: Endocrine, Eyes Negative for: Constitutional, Gastrointestinal, Neurological, Skin, Genitourinary, Musculoskeletal, HENT, Cardiovascular, Respiratory, Psychiatric, Allergic/Imm, Heme/Lymph Last edited by  Kingsley Spittle, COT on 10/25/2021 10:08 AM.     ALLERGIES No Known Allergies  PAST MEDICAL HISTORY Past Medical History:  Diagnosis Date   Diabetes mellitus without complication [facility])    Past Surgical History:  Procedure Laterality Date   AMPUTATION TOE Left 03/13/2021   Procedure: AMPUTATION BIG TOE;  Surgeon: Felipa Furnace, DPM;  Location: [facility] ORS;  Service: Podiatry;  Laterality: Left;   COCHLEAR IMPLANT  05/27/2020   [facility]   FAMILY HISTORY Family History  Problem Relation Age of Onset   Diabetes Father    SOCIAL HISTORY Social History   Tobacco Use   Smoking status: Never   Smokeless tobacco: Never  Vaping Use   Vaping Use: Never used  Substance Use Topics   Alcohol use: Not Currently   Drug use: Not Currently       OPHTHALMIC EXAM:  Base Eye Exam     Visual Acuity (Snellen - Linear)       Right Left   Dist cc 20/40 -1 20/20 -1   Dist ph cc NI     Correction: Glasses         Tonometry (Tonopen, 9:54 AM)       Right Left   Pressure 18 15         Pupils       Dark Light Shape React APD   Right 2 1 Round Minimal None   Left 2 1 Round Minimal None  Visual Fields (Counting fingers)       Left Right    Full Full         Extraocular Movement       Right Left    Full, Ortho Full, Ortho         Neuro/Psych     Oriented x3: Yes   Mood/Affect: Normal         Dilation     Both eyes: 1.0% Mydriacyl, 2.5% Phenylephrine @ 9:54 AM           Slit Lamp and Fundus Exam     Slit Lamp Exam       Right Left   Lids/Lashes Dermatochalasis - upper lid, Meibomian gland dysfunction Dermatochalasis - upper lid, Meibomian gland dysfunction   Conjunctiva/Sclera White and quiet White and quiet   Cornea trace endo pigment Clear   Anterior Chamber deep and clear deep and clear   Iris Round and dilated, No NVI Round and dilated, No NVI   Lens 1-2+ Cortical cataract trace cortical changes   Anterior Vitreous Vitreous  syneresis, +RBCs, blood stained vitreous condensations, focal pre-retinal heme along distal ST arcades, mild fibrosis along ST arcades Vitreous syneresis, +RBCs         Fundus Exam       Right Left   Disc hazy view, Sharp rim, +heme Pink and Sharp, no NVD   C/D Ratio 0.2 0.6   Macula hazy view, grossly flat, +fibrosis, +PRF Flat, Good foveal reflex, scattered MA / DBH greatest temporal macula, focal NVE just inside IT arcades   Vessels attenuated, Tortuous, +NV, +fibrosis attenuated, Tortuous, focal NV just inside IT arcades   Periphery Attached, scattered 360 PRP with room for fill in, scattered DBH, WWP temporally Attached, scattered MA / DBH, 360 PRP room for fill in temporally, WWP temporally and nasally            IMAGING AND PROCEDURES  Imaging and Procedures for 10/25/2021  OCT, Retina - OU - Both Eyes       Right Eye Quality was good. Central Foveal Thickness: 280. Progression has worsened. Findings include normal foveal contour, no IRF, no SRF, epiretinal membrane, macular pucker, preretinal fibrosis (PRF inferior and temporal midzone, interval worsening of vit opacities inferiorly).   Left Eye Quality was good. Central Foveal Thickness: 263. Progression has been stable. Findings include normal foveal contour, no IRF, no SRF, vitreomacular adhesion .   Notes *Images captured and stored on drive  Diagnosis / Impression:  OD: PRF inferior and temporal midzone, interval worsening of vit opacities inferiorly OS: NFP, no IRF / SRF  Clinical management:  See below  Abbreviations: NFP - Normal foveal profile. CME - cystoid macular edema. PED - pigment epithelial detachment. IRF - intraretinal fluid. SRF - subretinal fluid. EZ - ellipsoid zone. ERM - epiretinal membrane. ORA - outer retinal atrophy. ORT - outer retinal tubulation. SRHM - subretinal hyper-reflective material. IRHM - intraretinal hyper-reflective material      Intravitreal Injection, Pharmacologic Agent -  OD - Right Eye       Time Out 10/25/2021. 10:13 AM. Confirmed correct patient, procedure, site, and patient consented.   Anesthesia Topical anesthesia was used. Anesthetic medications included Lidocaine 2%, Proparacaine 0.5%.   Procedure Preparation included 5% betadine to ocular surface, eyelid speculum. A supplied needle was used.   Injection: 1.25 mg Bevacizumab 1.25mg /0.44ml   Route: Intravitreal, Site: Right Eye   NDC: B9831080, Lot: [id number] , Expiration date: 12/04/2021   Post-op Post injection exam  found visual acuity of at least counting fingers. The patient tolerated the procedure well. There were no complications. The patient received written and verbal post procedure care education. Post injection medications were not given.      Intravitreal Injection, Pharmacologic Agent - OS - Left Eye       Time Out 10/25/2021. 10:14 AM. Confirmed correct patient, procedure, site, and patient consented.   Anesthesia Topical anesthesia was used. Anesthetic medications included Lidocaine 2%, Proparacaine 0.5%.   Procedure Preparation included 5% betadine to ocular surface, eyelid speculum. A (32g) needle was used.   Injection: 1.25 mg Bevacizumab 1.25mg /0.1ml   Route: Intravitreal, Site: Left Eye   NDC: H061816, LotWJ:[id number], Expiration date: 11/15/2021   Post-op Post injection exam found visual acuity of at least counting fingers. The patient tolerated the procedure well. There were no complications. The patient received written and verbal post procedure care education. Post injection medications were not given.            ASSESSMENT/PLAN:    ICD-10-CM   1. 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 Vitreous hemorrhage of right eye (HCC)  H43.11 Intravitreal Injection, Pharmacologic Agent - OD - Right Eye    Bevacizumab (AVASTIN) SOLN 1.25 mg    3. Essential hypertension  I10     4. Hypertensive retinopathy of both eyes  H35.033      1,2. Proliferative diabetic retinopathy w/o DME, OU (OD > OS) + diabetic VH OD  - pt previously followed by retina specialist in [place], [place] -- moved to [place] area recently  - history of PRP OU  - pt presented 9.28.23 with 2 wk history of decreased vision OD -- saw Dr. Gwynn Burly who then referred here. - exam shows mild diffuse VH OD; PRP OU - BCVA 20/40 OD, 20/20 OS - FA (09.28.23) shows OD: Focal blockage along distal IT arcades corresponding to pre-retinal heme, scattered patches of NVE 360, mild NVD; OS: Focal patch of MA, vascular non-perfusion and NVE temporal macula -- pt would likely benefit from some fill in PRP - OCT without diabetic macular edema, both eyes  - discussed findings and prognosis - recommend IVA OU #1 today, 10.03.23 - pt wishes to proceed with injections - RBA of procedure discussed, questions answered - informed consent obtained and signed - see procedure note - VH precautions reviewed -- minimize activities, keep head elevated, avoid ASA/NSAIDs/blood thinners as able - f/u 1-2 weeks, DFE, OCT, possible PRP OS  3,4. Hypertensive retinopathy OU - discussed importance of tight BP control - monitor  Ophthalmic Meds Ordered this visit:  Meds ordered this encounter  Medications   Bevacizumab (AVASTIN) SOLN 1.25 mg   Bevacizumab (AVASTIN) SOLN 1.25 mg     Return for f/u 1-2 weeks, PDR OU, DFE, OCT.  There are no Patient Instructions on file for this visit.   Explained the diagnoses, plan, and follow up with the patient and they expressed understanding.  Patient expressed understanding of the importance of proper follow up care.   This document  serves as a record of services personally performed by Gardiner Sleeper, MD, PhD. It was created  on their behalf by San Jetty. Owens Shark, OA an ophthalmic technician. The creation of this record is the provider's dictation and/or activities during the visit.    Electronically signed by: San Jetty. Owens Shark, [place] 10.03.2023 11:49 AM  Gardiner Sleeper, M.D., Ph.D. Diseases & Surgery of the Retina and Vitreous [facility]  I have reviewed the above documentation for accuracy and completeness, and I agree with the above. Gardiner Sleeper, M.D., Ph.D. 10/25/21 11:49 AM  Abbreviations: M myopia (nearsighted); A astigmatism; H hyperopia (farsighted); P presbyopia; Mrx spectacle prescription;  CTL contact lenses; OD right eye; OS left eye; OU both eyes  XT exotropia; ET esotropia; PEK punctate epithelial keratitis; PEE punctate epithelial erosions; DES dry eye syndrome; MGD meibomian gland dysfunction; ATs artificial tears; PFAT's preservative free artificial tears; [place] nuclear sclerotic cataract; PSC posterior subcapsular cataract; ERM epi-retinal membrane; PVD posterior vitreous detachment; RD retinal detachment; DM diabetes mellitus; DR diabetic retinopathy; NPDR non-proliferative diabetic retinopathy; PDR proliferative diabetic retinopathy; CSME clinically significant macular edema; DME diabetic macular edema; dbh dot blot hemorrhages; CWS cotton wool spot; POAG primary open angle glaucoma; C/D cup-to-disc ratio; HVF humphrey visual field; GVF goldmann visual field; OCT optical coherence tomography; IOP intraocular pressure; BRVO Branch retinal vein occlusion; CRVO central retinal vein occlusion; CRAO central retinal artery occlusion; BRAO branch retinal artery occlusion; RT retinal tear; SB scleral buckle; PPV pars plana vitrectomy; VH Vitreous hemorrhage; PRP panretinal laser photocoagulation; IVK intravitreal kenalog; VMT vitreomacular traction; MH Macular hole;  NVD neovascularization of the  disc; NVE neovascularization elsewhere; AREDS age related eye disease study; ARMD age related macular degeneration; POAG primary open angle glaucoma; EBMD epithelial/anterior basement membrane dystrophy; ACIOL anterior chamber intraocular lens; IOL intraocular lens; PCIOL posterior chamber intraocular lens; Phaco/IOL phacoemulsification with intraocular lens placement; [place] photorefractive keratectomy; LASIK laser assisted in situ keratomileusis; HTN hypertension; DM diabetes mellitus; COPD chronic obstructive pulmonary disease

## 2021-10-26 NOTE — Progress Notes (Addendum)
[facility] - Clinic Note  11/08/2021     CHIEF COMPLAINT Patient presents for Retina Follow Up   HISTORY OF PRESENT ILLNESS: James [name] is a 41 y.o. male who presents to the clinic today for:   HPI     Retina Follow Up   Patient presents with  Diabetic Retinopathy.  In both eyes.  This started years ago.  Duration of 2 weeks.  I, the attending physician,  performed the HPI with the patient and updated documentation appropriately.        Comments   Patient states that he is able to see a lot better. He is unsure of his blood sugar and his A1c is 10.      Last edited by Rennis Chris, MD on 11/08/2021 11:46 AM.     Pt is here for PRP OS  Referring physician: No referring provider defined for this encounter.  HISTORICAL INFORMATION:   Selected notes from the MEDICAL RECORD NUMBER Referred by Dr. Santiago Bumpers for vitreous hemorrhage LEE:  Ocular Hx- PMH-    CURRENT MEDICATIONS: No current outpatient medications on file. (Ophthalmic Drugs)   No current facility-administered medications for this visit. (Ophthalmic Drugs)   Current Outpatient Medications (Other)  Medication Sig   acetaminophen (TYLENOL) 325 MG tablet Take 2 tablets (650 mg total) by mouth every 6 (six) hours as needed for mild pain (or Fever >/= 101).   ferrous sulfate 325 (65 FE) MG tablet Take 325 mg by mouth daily with breakfast.   insulin aspart (NOVOLOG) 100 UNIT/ML injection Insulin pump as you have been directed to use   pantoprazole (PROTONIX) 40 MG tablet Take 40 mg by mouth daily.   ranitidine (ZANTAC) 150 MG tablet Take 150 mg by mouth daily.   No current facility-administered medications for this visit. (Other)   REVIEW OF SYSTEMS: ROS   Positive for: Endocrine, Eyes Negative for: Constitutional, Gastrointestinal, Neurological, Skin, Genitourinary, Musculoskeletal, HENT, Cardiovascular, Respiratory, Psychiatric, Allergic/Imm, Heme/Lymph Last edited by Julieanne Cotton, COT on 11/08/2021  8:47 AM.     ALLERGIES No Known Allergies  PAST MEDICAL HISTORY Past Medical History:  Diagnosis Date   Diabetes mellitus without complication [facility])    Past Surgical History:  Procedure Laterality Date   AMPUTATION TOE Left 03/13/2021   Procedure: AMPUTATION BIG TOE;  Surgeon: Candelaria Stagers, DPM;  Location: [facility] ORS;  Service: Podiatry;  Laterality: Left;   COCHLEAR IMPLANT  05/27/2020   [facility]   FAMILY HISTORY Family History  Problem Relation Age of Onset   Diabetes Father    SOCIAL HISTORY Social History   Tobacco Use   Smoking status: Never   Smokeless tobacco: Never  Vaping Use   Vaping Use: Never used  Substance Use Topics   Alcohol use: Not Currently   Drug use: Not Currently       OPHTHALMIC EXAM:  Base Eye Exam     Visual Acuity (Snellen - Linear)       Right Left   Dist cc 20/30 20/20   Dist ph cc NI     Correction: Glasses         Tonometry (Tonopen, 8:50 AM)       Right Left   Pressure 18 16         Pupils       Dark Light Shape React APD   Right 2 2 Round Minimal None   Left 2 2 Round Minimal None  Visual Fields       Left Right    Full Full         Extraocular Movement       Right Left    Full, Ortho Full, Ortho         Neuro/Psych     Oriented x3: Yes   Mood/Affect: Normal         Dilation     Both eyes: 1.0% Mydriacyl, 2.5% Phenylephrine @ 8:47 AM           Slit Lamp and Fundus Exam     Slit Lamp Exam       Right Left   Lids/Lashes Dermatochalasis - upper lid, Meibomian gland dysfunction Dermatochalasis - upper lid, Meibomian gland dysfunction   Conjunctiva/Sclera White and quiet White and quiet   Cornea trace endo pigment Clear   Anterior Chamber deep and clear deep and clear   Iris Round and dilated, No NVI Round and dilated, No NVI   Lens 1-2+ Cortical cataract trace cortical changes   Anterior Vitreous Vitreous syneresis, +RBCs, blood stained vitreous  condensations, focal pre-retinal heme along distal ST arcades, mild fibrosis along ST arcades Vitreous syneresis, +RBCs         Fundus Exam       Right Left   Disc hazy view, Sharp rim, +heme Pink and Sharp, no NVD   C/D Ratio 0.2 0.6   Macula hazy view, grossly flat, +fibrosis, +PRF Flat, Good foveal reflex, scattered MA / DBH greatest temporal macula, focal NVE just inside IT arcades   Vessels attenuated, Tortuous, +NV, +fibrosis attenuated, Tortuous, focal NV just inside IT arcades   Periphery Attached, scattered 360 PRP with room for fill in, scattered DBH, WWP temporally Attached, scattered MA / DBH, 360 PRP room for fill in temporally, WWP temporally and nasally           IMAGING AND PROCEDURES  Imaging and Procedures for 11/08/2021  OCT, Retina - OU - Both Eyes       Right Eye Quality was good. Central Foveal Thickness: 270. Progression has improved. Findings include normal foveal contour, no IRF, no SRF, epiretinal membrane, macular pucker, preretinal fibrosis (PRF inferior and temporal midzone, interval improvement in vit opacities inferiorly).   Left Eye Quality was good. Central Foveal Thickness: 259. Progression has been stable. Findings include normal foveal contour, no IRF, no SRF, vitreomacular adhesion .   Notes *Images captured and stored on drive  Diagnosis / Impression:  OD: PRF inferior and temporal midzone, interval improvement in vit opacities inferiorly OS: NFP, no IRF / SRF  Clinical management:  See below  Abbreviations: NFP - Normal foveal profile. CME - cystoid macular edema. PED - pigment epithelial detachment. IRF - intraretinal fluid. SRF - subretinal fluid. EZ - ellipsoid zone. ERM - epiretinal membrane. ORA - outer retinal atrophy. ORT - outer retinal tubulation. SRHM - subretinal hyper-reflective material. IRHM - intraretinal hyper-reflective material      Panretinal Photocoagulation - OS - Left Eye       Time Out Confirmed correct  patient, procedure, site, and patient consented.   Anesthesia Topical anesthesia was used. Anesthetic medications included Proparacaine 0.5%.   Notes LASER PROCEDURE NOTE  Diagnosis:   Proliferative Diabetic Retinopathy, LEFT EYE  Procedure:  Pan-retinal photocoagulation using slit lamp laser, LEFT EYE, fill-in  Anesthesia:  Topical  Surgeon: Rennis Chris, MD, PhD   Informed consent obtained, operative eye marked, and time out performed prior to initiation of laser.  Lumenis [id number] slit lamp laser Pattern: 3x3 square Power: 300 mW Duration: 30 msec  Spot size: 200 microns  # spots: 745 spots fill-in -- mostly nasal and temporal  Complications: None.  RTC: Oct 31 -- DFE/OCT, possible injxns  Patient tolerated the procedure well and received written and verbal post-procedure care information/education.            ASSESSMENT/PLAN:    ICD-10-CM   1. Proliferative diabetic retinopathy of both eyes without macular edema associated with type 2 diabetes mellitus (HCC)  E11.3593 OCT, Retina - OU - Both Eyes    Panretinal Photocoagulation - OS - Left Eye    2. Vitreous hemorrhage of right eye ([place])  H43.11     3. Essential hypertension  I10     4. Hypertensive retinopathy of both eyes  H35.033      1,2. Proliferative diabetic retinopathy w/o DME, OU (OD > OS) + diabetic VH OD  - s/p IVA OU #1 (10.03.23)  - pt previously followed by retina specialist in [place], [place] -- moved to [place] area recently  - history of PRP OU  - pt presented 9.28.23 with 2 wk history of decreased vision OD -- saw Dr. Gwynn Burly who then referred here. - exam shows mild diffuse VH OD -- improving ; PRP OU - BCVA OD 20/30 from 20/40 OD, 20/20 OS - FA (09.28.23) shows OD: Focal blockage along distal IT arcades corresponding to pre-retinal heme, scattered patches of NVE 360, mild NVD; OS: Focal patch of MA, vascular non-perfusion and NVE temporal macula -- pt would likely benefit from some fill  in PRP - OCT without diabetic macular edema, both eyes  - discussed findings and prognosis - recommend fill in PRP OS today, 10.17.23 - pt wishes to proceed with laser - RBA of procedure discussed, questions answered - informed consent obtained and signed - see procedure note - start Lotemax QID OS x7 days - VH precautions reviewed -- minimize activities, keep head elevated, avoid ASA/NSAIDs/blood thinners as able - f/u October 31 or later, DFE, OCT, possible injection(s)  3,4. Hypertensive retinopathy OU - discussed importance of tight BP control - monitor  Ophthalmic Meds Ordered this visit:  No orders of the defined types were placed in this encounter.    Return in about 2 weeks (around 11/22/2021) for PDR OU, VH OD, Dilated Exam, OCT, Possible Injxn.  There are no Patient Instructions on file for this visit.   Explained the diagnoses, plan, and follow up with the patient and they expressed understanding.  Patient expressed understanding of the importance of proper follow up care.   This document serves as a record of services personally performed by Gardiner Sleeper, MD, PhD. It was created on their behalf by Renaldo Reel, [place] an ophthalmic technician. The creation of this record is the provider's dictation and/or activities during the visit.    Electronically signed by:  Renaldo Reel, COT  10.04.23 11:46 AM  This document serves as a record of services personally performed by Gardiner Sleeper, MD, PhD. It was created on their behalf by San Jetty. Owens Shark, OA an ophthalmic technician. The creation of this record is the provider's dictation and/or activities during the visit.    Electronically signed by: San Jetty. [place], [place] 10.17.2023 11:46 AM  Gardiner Sleeper, M.D., Ph.D. Diseases & Surgery of the Retina and Vitreous [facility]  I have reviewed the above documentation for accuracy and completeness, and I agree with the above.  Karie Chimera,  M.D., Ph.D. 11/08/21 11:46 AM  Abbreviations: M myopia (nearsighted); A astigmatism; H hyperopia (farsighted); P presbyopia; Mrx spectacle prescription;  CTL contact lenses; OD right eye; OS left eye; OU both eyes  XT exotropia; ET esotropia; PEK punctate epithelial keratitis; PEE punctate epithelial erosions; DES dry eye syndrome; MGD meibomian gland dysfunction; ATs artificial tears; PFAT's preservative free artificial tears; NSC nuclear sclerotic cataract; PSC posterior subcapsular cataract; ERM epi-retinal membrane; PVD posterior vitreous detachment; RD retinal detachment; DM diabetes mellitus; DR diabetic retinopathy; NPDR non-proliferative diabetic retinopathy; PDR proliferative diabetic retinopathy; CSME clinically significant macular edema; DME diabetic macular edema; dbh dot blot hemorrhages; CWS cotton wool spot; POAG primary open angle glaucoma; C/D cup-to-disc ratio; HVF humphrey visual field; GVF goldmann visual field; OCT optical coherence tomography; IOP intraocular pressure; BRVO Branch retinal vein occlusion; CRVO central retinal vein occlusion; CRAO central retinal artery occlusion; BRAO branch retinal artery occlusion; RT retinal tear; SB scleral buckle; PPV pars plana vitrectomy; VH Vitreous hemorrhage; PRP panretinal laser photocoagulation; IVK intravitreal kenalog; VMT vitreomacular traction; MH Macular hole;  NVD neovascularization of the disc; NVE neovascularization elsewhere; AREDS age related eye disease study; ARMD age related macular degeneration; POAG primary open angle glaucoma; EBMD epithelial/anterior basement membrane dystrophy; ACIOL anterior chamber intraocular lens; IOL intraocular lens; PCIOL posterior chamber intraocular lens; Phaco/IOL phacoemulsification with intraocular lens placement; PRK photorefractive keratectomy; LASIK laser assisted in situ keratomileusis; HTN hypertension; DM diabetes mellitus; COPD chronic obstructive pulmonary disease

## 2021-11-08 ENCOUNTER — Encounter (INDEPENDENT_AMBULATORY_CARE_PROVIDER_SITE_OTHER): Payer: Self-pay | Admitting: Ophthalmology

## 2021-11-08 ENCOUNTER — Ambulatory Visit (INDEPENDENT_AMBULATORY_CARE_PROVIDER_SITE_OTHER): Payer: Managed Care, Other (non HMO) | Admitting: Ophthalmology

## 2021-11-08 DIAGNOSIS — H35033 Hypertensive retinopathy, bilateral: Secondary | ICD-10-CM | POA: Diagnosis not present

## 2021-11-08 DIAGNOSIS — H4311 Vitreous hemorrhage, right eye: Secondary | ICD-10-CM | POA: Diagnosis not present

## 2021-11-08 DIAGNOSIS — E113593 Type 2 diabetes mellitus with proliferative diabetic retinopathy without macular edema, bilateral: Secondary | ICD-10-CM | POA: Diagnosis not present

## 2021-11-08 DIAGNOSIS — I10 Essential (primary) hypertension: Secondary | ICD-10-CM | POA: Diagnosis not present

## 2021-11-08 NOTE — Progress Notes (Signed)
[facility] Note  11/22/2021     CHIEF COMPLAINT Patient presents for Retina Follow Up   HISTORY OF PRESENT ILLNESS: James [name] is a 41 y.o. male who presents to the clinic today for:   HPI     Retina Follow Up   Patient presents with  Diabetic Retinopathy.  In both eyes.  This started 2 weeks ago.  I, the attending physician,  performed the HPI with the patient and updated documentation appropriately.        Comments   Patient here for 2 weeks retina follow up for PDR OU. Patient states vision doing ok. No eye pain.       Last edited by Bernarda Caffey, MD on 11/22/2021 12:26 PM.    Pt states right eye vision is improving, no problems with laser procedure at last visit  Referring physician: No referring provider defined for this encounter.  HISTORICAL INFORMATION:   Selected notes from the MEDICAL RECORD NUMBER Referred by Dr. Gwynn Burly for vitreous hemorrhage LEE:  Ocular Hx- PMH-    CURRENT MEDICATIONS: No current outpatient medications on file. (Ophthalmic Drugs)   No current facility-administered medications for this visit. (Ophthalmic Drugs)   Current Outpatient Medications (Other)  Medication Sig   acetaminophen (TYLENOL) 325 MG tablet Take 2 tablets (650 mg total) by mouth every 6 (six) hours as needed for mild pain (or Fever >/= 101).   ferrous sulfate 325 (65 FE) MG tablet Take 325 mg by mouth daily with breakfast.   insulin aspart (NOVOLOG) 100 UNIT/ML injection Insulin pump as you have been directed to use   pantoprazole (PROTONIX) 40 MG tablet Take 40 mg by mouth daily.   ranitidine (ZANTAC) 150 MG tablet Take 150 mg by mouth daily.   No current facility-administered medications for this visit. (Other)   REVIEW OF SYSTEMS: ROS   Positive for: Endocrine, Eyes Negative for: Constitutional, Gastrointestinal, Neurological, Skin, Genitourinary, Musculoskeletal, HENT, Cardiovascular, Respiratory, Psychiatric,  Allergic/Imm, Heme/Lymph Last edited by Theodore Demark, COA on 11/22/2021  9:29 AM.     ALLERGIES No Known Allergies  PAST MEDICAL HISTORY Past Medical History:  Diagnosis Date   Diabetes mellitus without complication [facility])    Past Surgical History:  Procedure Laterality Date   AMPUTATION TOE Left 03/13/2021   Procedure: AMPUTATION BIG TOE;  Surgeon: Felipa Furnace, DPM;  Location: [facility] ORS;  Service: Podiatry;  Laterality: Left;   COCHLEAR IMPLANT  05/27/2020   [facility]   FAMILY HISTORY Family History  Problem Relation Age of Onset   Diabetes Father    SOCIAL HISTORY Social History   Tobacco Use   Smoking status: Never   Smokeless tobacco: Never  Vaping Use   Vaping Use: Never used  Substance Use Topics   Alcohol use: Not Currently   Drug use: Not Currently       OPHTHALMIC EXAM:  Base Eye Exam     Visual Acuity (Snellen - Linear)       Right Left   Dist cc 20/25 -1 20/20 -1   Dist ph cc 20/25 +2     Correction: Glasses         Tonometry (Tonopen, 9:24 AM)       Right Left   Pressure 12 09         Pupils       Dark Light Shape React APD   Right 2 1 Round Minimal None   Left 2 1 Round Minimal None  Visual Fields (Counting fingers)       Left Right    Full Full         Extraocular Movement       Right Left    Full, Ortho Full, Ortho         Neuro/Psych     Oriented x3: Yes   Mood/Affect: Normal         Dilation     Both eyes: 1.0% Mydriacyl, 2.5% Phenylephrine @ 9:24 AM           Slit Lamp and Fundus Exam     Slit Lamp Exam       Right Left   Lids/Lashes Dermatochalasis - upper lid, Meibomian gland dysfunction Dermatochalasis - upper lid, Meibomian gland dysfunction   Conjunctiva/Sclera White and quiet White and quiet   Cornea trace endo pigment Clear   Anterior Chamber deep and clear deep and clear   Iris Round and dilated, No NVI Round and dilated, No NVI   Lens 1-2+ Cortical cataract trace cortical  changes   Anterior Vitreous Vitreous syneresis, +RBCs, blood stained vitreous condensations -- slightly improved, focal pre-retinal heme along distal ST arcades, mild fibrosis along ST arcades Vitreous syneresis, +RBCs         Fundus Exam       Right Left   Disc Sharp rim, +heme -- improved Pink and Sharp, no NVD   C/D Ratio 0.2 0.6   Macula Good foveal reflex, pre-retinal heme inferiorly, scattered MA, focal fibrosis ST mac Flat, Good foveal reflex, scattered MA / DBH greatest temporal macula -- improved, focal NVE just inside IT arcades -- improved   Vessels attenuated, Tortuous, +NV, +fibrosis attenuated, Tortuous, focal NV just inside IT arcades -- improving   Periphery Attached, scattered 360 PRP with room for fill in, scattered DBH, WWP temporally Attached, scattered MA / DBH, 360 PRP with good early posterior fill in changes, WWP temporally and nasally           Refraction     Wearing Rx       Sphere Cylinder Axis Add   Right -5.00 +0.75 022 none   Left -5.00 +1.50 136 none           IMAGING AND PROCEDURES  Imaging and Procedures for 11/22/2021  OCT, Retina - OU - Both Eyes       Right Eye Quality was good. Central Foveal Thickness: 270. Progression has improved. Findings include normal foveal contour, no IRF, no SRF, epiretinal membrane, macular pucker, preretinal fibrosis (PRF inferior and temporal midzone, mild interval improvement in vit opacities).   Left Eye Quality was good. Central Foveal Thickness: 264. Progression has been stable. Findings include normal foveal contour, no IRF, no SRF, vitreomacular adhesion .   Notes *Images captured and stored on drive  Diagnosis / Impression:  OD: PRF inferior and temporal midzone, mild interval improvement in vit opacities OS: NFP, no IRF / SRF  Clinical management:  See below  Abbreviations: NFP - Normal foveal profile. CME - cystoid macular edema. PED - pigment epithelial detachment. IRF - intraretinal  fluid. SRF - subretinal fluid. EZ - ellipsoid zone. ERM - epiretinal membrane. ORA - outer retinal atrophy. ORT - outer retinal tubulation. SRHM - subretinal hyper-reflective material. IRHM - intraretinal hyper-reflective material      Intravitreal Injection, Pharmacologic Agent - OD - Right Eye       Time Out 11/22/2021. 9:54 AM. Confirmed correct patient, procedure, site, and patient consented.   Anesthesia  Topical anesthesia was used. Anesthetic medications included Lidocaine 2%, Proparacaine 0.5%.   Procedure Preparation included 5% betadine to ocular surface, eyelid speculum. A supplied needle was used.   Injection: 1.25 mg Bevacizumab 1.77m/0.05ml   Route: Intravitreal, Site: Right Eye   NDC:: 56433-295-18 Lot: [id number] , Expiration date: 12/04/2021   Post-op Post injection exam found visual acuity of at least counting fingers. The patient tolerated the procedure well. There were no complications. The patient received written and verbal post procedure care education. Post injection medications were not given.      Intravitreal Injection, Pharmacologic Agent - OS - Left Eye       Time Out 11/22/2021. 9:54 AM. Confirmed correct patient, procedure, site, and patient consented.   Anesthesia Topical anesthesia was used. Anesthetic medications included Lidocaine 2%, Proparacaine 0.5%.   Procedure Preparation included 5% betadine to ocular surface, eyelid speculum. A (32g) needle was used.   Injection: 1.25 mg Bevacizumab 1.245m0.05ml   Route: Intravitreal, Site: Left Eye   NDC: : 84166-063-01Lot: : [id number] date: 12/21/2021   Post-op Post injection exam found visual acuity of at least counting fingers. The patient tolerated the procedure well. There were no complications. The patient received written and verbal post procedure care education. Post injection medications were not given.            ASSESSMENT/PLAN:    ICD-10-CM   1. 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 Vitreous hemorrhage of right eye ([place] H43.11     3. Essential hypertension  I10     4. Hypertensive retinopathy of both eyes  H35.033       1,2. Proliferative diabetic retinopathy w/o DME, OU (OD > OS) + diabetic VH OD  - [place]  - history of PRP OU  - pt presented 9.28.23 with 2 wk history of decreased vision OD -- saw Dr. ThGwynn Burlyho then referred here. - FA (09.28.23) shows OD: Focal blockage along distal IT arcades corresponding to pre-retinal heme, scattered patches of NVE 360, mild NVD; OS: Focal patch of MA, vascular non-perfusion and NVE temporal macula -- pt would likely benefit from some fill in PRP  - s/p IVA OU #1 (10.03.23)  - s/p fill in PRP OS (10.17.23) - exam shows mild diffuse VH OD -- improving; good posterior PRP fill in OS - BCVA OD 20/25 from 20/30 OD, 20/20 OS - OCT shows OD: PRF inferior and temporal midzone, mild interval improvement in vit opacities inferiorly - discussed findings and prognosis - recommend IVA OU #2 today, 10.31.23 - pt wishes to proceed with laser - RBA of procedure discussed, questions answered - informed consent obtained and signed - see procedure note - VH precautions reviewed -- minimize activities, keep head elevated, avoid ASA/NSAIDs/blood thinners as able - f/u 4 weeks, DFE, OCT, possible injection(s)  3,4. Hypertensive retinopathy OU - discussed importance of tight BP control - monitor  Ophthalmic Meds Ordered this visit:  Meds ordered this encounter  Medications   Bevacizumab (AVASTIN) SOLN  1.25 mg   Bevacizumab (AVASTIN) SOLN 1.25 mg  Return in about 4 weeks (around 12/20/2021) for f/u PDR OU, DFE, OCT.  There are no Patient Instructions on file for this visit.   Explained the diagnoses, plan, and follow up with the patient and they expressed understanding.  Patient expressed understanding of the importance of proper follow up care.   This document serves as a record of services personally performed by Gardiner Sleeper, MD, PhD. It was created on their behalf by Renaldo Reel, [place] an ophthalmic technician. The creation of this record is the provider's dictation and/or activities during the visit.    Electronically signed by:  Renaldo Reel, COT  10.17.23 12:33 PM  This document serves as a record of services personally performed by Gardiner Sleeper, MD, PhD. It was created on their behalf by San Jetty. Owens Shark, OA an ophthalmic technician. The creation of this record is the provider's dictation and/or activities during the visit.    Electronically signed by: San Jetty. Owens Shark, [place] 10.31.2023 12:33 PM   Gardiner Sleeper, M.D., Ph.D. Diseases & Surgery of the Retina and Vitreous [facility]  I have reviewed the above documentation for accuracy and completeness, and I agree with the above. Gardiner Sleeper, M.D., Ph.D. 11/22/21 12:33 PM   Abbreviations: M myopia (nearsighted); A astigmatism; H hyperopia (farsighted); P presbyopia; Mrx spectacle prescription;  CTL contact lenses; OD right eye; OS left eye; OU both eyes  XT exotropia; ET esotropia; PEK punctate epithelial keratitis; PEE punctate epithelial erosions; DES dry eye syndrome; MGD meibomian gland dysfunction; ATs artificial tears; PFAT's preservative free artificial tears; [place] nuclear sclerotic cataract; PSC posterior subcapsular cataract; ERM epi-retinal membrane; PVD posterior vitreous detachment; RD retinal detachment; DM diabetes mellitus; DR diabetic retinopathy; NPDR non-proliferative  diabetic retinopathy; PDR proliferative diabetic retinopathy; CSME clinically significant macular edema; DME diabetic macular edema; dbh dot blot hemorrhages; CWS cotton wool spot; POAG primary open angle glaucoma; C/D cup-to-disc ratio; HVF humphrey visual field; GVF goldmann visual field; OCT optical coherence tomography; IOP intraocular pressure; BRVO Branch retinal vein occlusion; CRVO central retinal vein occlusion; CRAO central retinal artery occlusion; BRAO branch retinal artery occlusion; RT retinal tear; SB scleral buckle; PPV pars plana vitrectomy; VH Vitreous hemorrhage; PRP panretinal laser photocoagulation; IVK intravitreal kenalog; VMT vitreomacular traction; MH Macular hole;  NVD neovascularization of the disc; NVE neovascularization elsewhere; AREDS age related eye disease study; ARMD age related macular degeneration; POAG primary open angle glaucoma; EBMD epithelial/anterior basement membrane dystrophy; ACIOL anterior chamber intraocular lens; IOL intraocular lens; PCIOL posterior chamber intraocular lens; Phaco/IOL phacoemulsification with intraocular lens placement; [place] photorefractive keratectomy; LASIK laser assisted in situ keratomileusis; HTN hypertension; DM diabetes mellitus; COPD chronic obstructive pulmonary disease

## 2021-11-22 ENCOUNTER — Ambulatory Visit (INDEPENDENT_AMBULATORY_CARE_PROVIDER_SITE_OTHER): Payer: Managed Care, Other (non HMO) | Admitting: Ophthalmology

## 2021-11-22 ENCOUNTER — Encounter (INDEPENDENT_AMBULATORY_CARE_PROVIDER_SITE_OTHER): Payer: Self-pay | Admitting: Ophthalmology

## 2021-11-22 DIAGNOSIS — H35033 Hypertensive retinopathy, bilateral: Secondary | ICD-10-CM

## 2021-11-22 DIAGNOSIS — I10 Essential (primary) hypertension: Secondary | ICD-10-CM | POA: Diagnosis not present

## 2021-11-22 DIAGNOSIS — E113593 Type 2 diabetes mellitus with proliferative diabetic retinopathy without macular edema, bilateral: Secondary | ICD-10-CM | POA: Diagnosis not present

## 2021-11-22 DIAGNOSIS — H4311 Vitreous hemorrhage, right eye: Secondary | ICD-10-CM | POA: Diagnosis not present

## 2021-11-22 MED ORDER — BEVACIZUMAB CHEMO INJECTION 1.25MG/0.05ML SYRINGE FOR KALEIDOSCOPE
1.2500 mg | INTRAVITREAL | Status: AC | PRN
  Administered 2021-11-22: 1.25 mg via INTRAVITREAL

## 2021-11-23 DIAGNOSIS — Z419 Encounter for procedure for purposes other than remedying health state, unspecified: Secondary | ICD-10-CM | POA: Diagnosis not present

## 2021-12-06 NOTE — Progress Notes (Signed)
[facility] Note  12/20/2021     CHIEF COMPLAINT Patient presents for Retina Follow Up   HISTORY OF PRESENT ILLNESS: James [name] is a 41 y.o. male who presents to the clinic today for:   HPI     Retina Follow Up   Patient presents with  Diabetic Retinopathy.  In both eyes.  This started 4 weeks ago.  Duration of 4 weeks.  I, the attending physician,  performed the HPI with the patient and updated documentation appropriately.        Comments   4 week retina follow up PDR OU and IVA OU pt is reporting no vision changes noticed       Last edited by Bernarda Caffey, MD on 12/20/2021 12:22 PM.    Pt states vision is improving, he is not seeing any floaters in the right eye  Referring physician: No referring provider defined for this encounter.  HISTORICAL INFORMATION:   Selected notes from the MEDICAL RECORD NUMBER Referred by Dr. Gwynn Burly for vitreous hemorrhage LEE:  Ocular Hx- PMH-    CURRENT MEDICATIONS: No current outpatient medications on file. (Ophthalmic Drugs)   No current facility-administered medications for this visit. (Ophthalmic Drugs)   Current Outpatient Medications (Other)  Medication Sig   acetaminophen (TYLENOL) 325 MG tablet Take 2 tablets (650 mg total) by mouth every 6 (six) hours as needed for mild pain (or Fever >/= 101).   ferrous sulfate 325 (65 FE) MG tablet Take 325 mg by mouth daily with breakfast.   insulin aspart (NOVOLOG) 100 UNIT/ML injection Insulin pump as you have been directed to use   pantoprazole (PROTONIX) 40 MG tablet Take 40 mg by mouth daily.   ranitidine (ZANTAC) 150 MG tablet Take 150 mg by mouth daily.   No current facility-administered medications for this visit. (Other)   REVIEW OF SYSTEMS:   ALLERGIES No Known Allergies  PAST MEDICAL HISTORY Past Medical History:  Diagnosis Date   Diabetes mellitus without complication [facility])    Past Surgical History:  Procedure Laterality  Date   AMPUTATION TOE Left 03/13/2021   Procedure: AMPUTATION BIG TOE;  Surgeon: Felipa Furnace, DPM;  Location: [facility] ORS;  Service: Podiatry;  Laterality: Left;   COCHLEAR IMPLANT  05/27/2020   [facility]   FAMILY HISTORY Family History  Problem Relation Age of Onset   Diabetes Father    SOCIAL HISTORY Social History   Tobacco Use   Smoking status: Never   Smokeless tobacco: Never  Vaping Use   Vaping Use: Never used  Substance Use Topics   Alcohol use: Not Currently   Drug use: Not Currently       OPHTHALMIC EXAM:  Base Eye Exam     Visual Acuity (Snellen - Linear)       Right Left   Dist cc 20/25 20/20 -1    Correction: Glasses         Tonometry (Tonopen, 9:06 AM)       Right Left   Pressure 13 15         Pupils       Pupils Dark Light Shape React APD   Right PERRL 3 1 Round Brisk None   Left PERRL 3 1 Round Brisk None         Visual Fields       Left Right    Full Full         Extraocular Movement  Right Left    Full, Ortho Full, Ortho         Neuro/Psych     Oriented x3: Yes   Mood/Affect: Normal         Dilation     Both eyes: 2.5% Phenylephrine @ 9:06 AM           Slit Lamp and Fundus Exam     Slit Lamp Exam       Right Left   Lids/Lashes Dermatochalasis - upper lid, Meibomian gland dysfunction Dermatochalasis - upper lid, Meibomian gland dysfunction   Conjunctiva/Sclera White and quiet White and quiet   Cornea trace endo pigment Clear   Anterior Chamber deep and clear deep and clear   Iris Round and dilated, No NVI Round and dilated, No NVI   Lens 1-2+ Cortical cataract trace cortical changes   Anterior Vitreous Vitreous syneresis, +RBCs, blood stained vitreous condensations -- slightly improved, focal pre-retinal heme along distal ST arcades, mild fibrosis along ST arcades, blood clots settled inferiorly Vitreous syneresis, +RBCs         Fundus Exam       Right Left   Disc Sharp rim, +heme -- improved  Pink and Sharp, no NVD   C/D Ratio 0.2 0.6   Macula Good foveal reflex, pre-retinal heme inferiorly -- improving, scattered MA, focal fibrosis ST mac Flat, Good foveal reflex, scattered MA / DBH greatest temporal macula -- improved, focal NVE just inside IT arcades -- improved   Vessels attenuated, Tortuous, +NV, +fibrosis attenuated, Tortuous, focal NV just inside IT arcades -- improving   Periphery Attached, scattered 360 PRP with room for fill in, scattered DBH, WWP temporally Attached, scattered MA / DBH, 360 PRP with good posterior fill in changes, WWP temporally and nasally           Refraction     Wearing Rx       Sphere Cylinder Axis Add   Right -5.00 +0.75 022 none   Left -5.00 +1.50 136 none           IMAGING AND PROCEDURES  Imaging and Procedures for 12/20/2021  OCT, Retina - OU - Both Eyes       Right Eye Quality was good. Central Foveal Thickness: 270. Progression has improved. Findings include normal foveal contour, no IRF, no SRF, epiretinal membrane, macular pucker, preretinal fibrosis (PRF inferior and temporal midzone -- slightly improved, mild interval improvement in vit opacities).   Left Eye Quality was good. Central Foveal Thickness: 263. Progression has been stable. Findings include normal foveal contour, no IRF, no SRF, vitreomacular adhesion .   Notes *Images captured and stored on drive  Diagnosis / Impression:  OD: PRF inferior and temporal midzone -- slightly improved, mild interval improvement in vit opacities OS: NFP, no IRF / SRF  Clinical management:  See below  Abbreviations: NFP - Normal foveal profile. CME - cystoid macular edema. PED - pigment epithelial detachment. IRF - intraretinal fluid. SRF - subretinal fluid. EZ - ellipsoid zone. ERM - epiretinal membrane. ORA - outer retinal atrophy. ORT - outer retinal tubulation. SRHM - subretinal hyper-reflective material. IRHM - intraretinal hyper-reflective material      Intravitreal  Injection, Pharmacologic Agent - OD - Right Eye       Time Out 12/20/2021. 10:05 AM. Confirmed correct patient, procedure, site, and patient consented.   Anesthesia Topical anesthesia was used. Anesthetic medications included Lidocaine 2%, Proparacaine 0.5%.   Procedure Preparation included 5% betadine to ocular surface, eyelid speculum. A (  32g) needle was used.   Injection: 1.25 mg Bevacizumab 1.23m/0.05ml   Route: Intravitreal, Site: Right Eye   NDC: 50242-060-01, Lot:: [id number] Expiration date: 01/08/2022   Post-op Post injection exam found visual acuity of at least counting fingers. The patient tolerated the procedure well. There were no complications. The patient received written and verbal post procedure care education. Post injection medications were not given.      Intravitreal Injection, Pharmacologic Agent - OS - Left Eye       Time Out 12/20/2021. 10:06 AM. Confirmed correct patient, procedure, site, and patient consented.   Anesthesia Topical anesthesia was used. Anesthetic medications included Lidocaine 2%, Proparacaine 0.5%.   Procedure Preparation included 5% betadine to ocular surface, eyelid speculum. A supplied (32g) needle was used.   Injection: 1.25 mg Bevacizumab 1.247m0.05ml   Route: Intravitreal, Site: Left Eye   NDC: : 45409-811-91Lot: : [id number] date: 01/16/2022   Post-op Post injection exam found visual acuity of at least counting fingers. The patient tolerated the procedure well. There were no complications. The patient received written and verbal post procedure care education. Post injection medications were not given.            ASSESSMENT/PLAN:    ICD-10-CM   1. 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 Vitreous hemorrhage of right eye ([place] H43.11     3. Essential hypertension  I10     4. Hypertensive retinopathy of both eyes  H35.033       1,2. Proliferative diabetic retinopathy w/o DME, OU (OD > OS) + diabetic VH OD  - [place]  - history of PRP OU  - pt presented 9.28.23 with 2 wk history of decreased vision OD -- saw Dr. ThGwynn Burlyho then referred here. - FA (09.28.23) shows OD: Focal blockage along distal IT arcades corresponding to pre-retinal heme, scattered patches of NVE 360, mild NVD; OS: Focal patch of MA, vascular non-perfusion and NVE temporal macula -- pt would likely benefit from some fill in PRP  - s/p IVA OU #1 (10.03.23), #2 (10.31.23)  - s/p fill in PRP OS (10.17.23) - exam shows mild diffuse VH OD -- improving and clear enough for fill in PRP OD; good posterior PRP fill in OS - BCVA OD stable at 20/25 OD, 20/20 OS - OCT shows OD: PRF inferior and temporal midzone -- slightly improved, mild interval improvement in vit opacities inferiorly - discussed findings and prognosis - recommend IVA OU #3 today, 11.28.23 - pt wishes to proceed with laser - RBA of procedure discussed, questions answered - informed consent obtained and signed - see procedure note - VH precautions reviewed -- minimize activities, keep head elevated, avoid ASA/NSAIDs/blood thinners as able - f/u next Weds at 2:45, DFE, OCT, possible PRP fill in OD - f/u 4-5 weeks for injection(s) (~December 26)  3,4. Hypertensive retinopathy OU - discussed importance of tight BP control - monitor  Ophthalmic Meds Ordered this visit:  Meds ordered this encounter  Medications   Bevacizumab (AVASTIN) SOLN 1.25 mg   Bevacizumab (AVASTIN) SOLN 1.25  mg     Return in about 8 days (around 12/28/2021) for f/u PDR OU, DFE, OCT,  Laser.  There are no Patient Instructions on file for this visit.   Explained the diagnoses, plan, and follow up with the patient and they expressed understanding.  Patient expressed understanding of the importance of proper follow up care.   This document serves as a record of services personally performed by Gardiner Sleeper, MD, PhD. It was created on their behalf by Renaldo Reel, [place] an ophthalmic technician. The creation of this record is the provider's dictation and/or activities during the visit.    Electronically signed by:  Renaldo Reel, COT  11.14.23 12:27 PM  This document serves as a record of services personally performed by Gardiner Sleeper, MD, PhD. It was created on their behalf by San Jetty. Owens Shark, OA an ophthalmic technician. The creation of this record is the provider's dictation and/or activities during the visit.    Electronically signed by: San Jetty. Marguerita Merles 11.28.2023 12:27 PM  Gardiner Sleeper, M.D., Ph.D. Diseases & Surgery of the Retina and Vitreous [facility]  I have reviewed the above documentation for accuracy and completeness, and I agree with the above. Gardiner Sleeper, M.D., Ph.D. 12/20/21 12:29 PM   Abbreviations: M myopia (nearsighted); A astigmatism; H hyperopia (farsighted); P presbyopia; Mrx spectacle prescription;  CTL contact lenses; OD right eye; OS left eye; OU both eyes  XT exotropia; ET esotropia; PEK punctate epithelial keratitis; PEE punctate epithelial erosions; DES dry eye syndrome; MGD meibomian gland dysfunction; ATs artificial tears; PFAT's preservative free artificial tears; [place] nuclear sclerotic cataract; PSC posterior subcapsular cataract; ERM epi-retinal membrane; PVD posterior vitreous detachment; RD retinal detachment; DM diabetes mellitus; DR diabetic retinopathy; NPDR non-proliferative diabetic retinopathy; PDR proliferative diabetic retinopathy; CSME clinically significant macular edema; DME diabetic  macular edema; dbh dot blot hemorrhages; CWS cotton wool spot; POAG primary open angle glaucoma; C/D cup-to-disc ratio; HVF humphrey visual field; GVF goldmann visual field; OCT optical coherence tomography; IOP intraocular pressure; BRVO Branch retinal vein occlusion; CRVO central retinal vein occlusion; CRAO central retinal artery occlusion; BRAO branch retinal artery occlusion; RT retinal tear; SB scleral buckle; PPV pars plana vitrectomy; VH Vitreous hemorrhage; PRP panretinal laser photocoagulation; IVK intravitreal kenalog; VMT vitreomacular traction; MH Macular hole;  NVD neovascularization of the disc; NVE neovascularization elsewhere; AREDS age related eye disease study; ARMD age related macular degeneration; POAG primary open angle glaucoma; EBMD epithelial/anterior basement membrane dystrophy; ACIOL anterior chamber intraocular lens; IOL intraocular lens; PCIOL posterior chamber intraocular lens; Phaco/IOL phacoemulsification with intraocular lens placement; [place] photorefractive keratectomy; LASIK laser assisted in situ keratomileusis; HTN hypertension; DM diabetes mellitus; COPD chronic obstructive pulmonary disease

## 2021-12-20 ENCOUNTER — Ambulatory Visit (INDEPENDENT_AMBULATORY_CARE_PROVIDER_SITE_OTHER): Payer: Managed Care, Other (non HMO) | Admitting: Ophthalmology

## 2021-12-20 ENCOUNTER — Encounter (INDEPENDENT_AMBULATORY_CARE_PROVIDER_SITE_OTHER): Payer: Self-pay | Admitting: Ophthalmology

## 2021-12-20 DIAGNOSIS — E113593 Type 2 diabetes mellitus with proliferative diabetic retinopathy without macular edema, bilateral: Secondary | ICD-10-CM | POA: Diagnosis not present

## 2021-12-20 DIAGNOSIS — I10 Essential (primary) hypertension: Secondary | ICD-10-CM | POA: Diagnosis not present

## 2021-12-20 DIAGNOSIS — H4311 Vitreous hemorrhage, right eye: Secondary | ICD-10-CM

## 2021-12-20 DIAGNOSIS — H35033 Hypertensive retinopathy, bilateral: Secondary | ICD-10-CM | POA: Diagnosis not present

## 2021-12-20 MED ORDER — BEVACIZUMAB CHEMO INJECTION 1.25MG/0.05ML SYRINGE FOR KALEIDOSCOPE
1.2500 mg | INTRAVITREAL | Status: AC | PRN
  Administered 2021-12-20: 1.25 mg via INTRAVITREAL

## 2021-12-20 NOTE — Progress Notes (Unsigned)
Name: James [name]  MRN/ DOB: [id number], [birth year]   Age/ Sex: 41 y.o., male    PCP: Pcp, No   Reason for Endocrinology Evaluation: Type 1 Diabetes Mellitus     Date of Initial Endocrinology Visit: 12/21/2021     PATIENT IDENTIFIER: James [name] is a 41 y.o. male with a past medical history of T1DM. The patient presented for initial endocrinology clinic visit on 12/21/2021 for consultative assistance with his diabetes management.    HPI: James [name] was    Diagnosed with DM at age 42 Prior Medications tried/Intolerance: has been on the pump for years  Currently checking blood sugars multiple  x / day through CGM Hypoglycemia episodes : yes               Symptoms: yes                 Frequency: rare Hemoglobin A1c has ranged from 8.3% in 2022, peaking at 9.2% in 2023.  In terms of diet, the patient eats 3 meals a day. Avoids sugar sweetened beverages   This patient with type 1 diabetes is treated with Tandem  (insulin pump). During the visit the pump basal and bolus doses were reviewed including carb/insulin rations and supplemental doses. The clinical list was updated. The glucose meter download was reviewed in detail to determine if the current pump settings are providing the best glycemic control without excessive hypoglycemia.    Pump and meter download:    Pump   Tandem   Settings   Insulin type   Novolog    Basal rate       0000 0.675 u/h    0600 0.875   1700 0.79   2130 0.75          I:C ratio       0000 1:15                  Sensitivity       0000  50      Goal       0000  110             Type & Model of Pump: Tandem  Insulin Type: Currently using Novolog .  Body mass index is 22.21 kg/m.  PUMP STATISTICS: Unable to download      HOME DIABETES REGIMEN: Novolog    Statin: yes ACE-I/ARB: no    CONTINUOUS GLUCOSE MONITORING RECORD INTERPRETATION  : Unable to download     DIABETIC COMPLICATIONS: Microvascular  complications:  S/P left great toe amputation, retinopathy B/L ( he is on injection)  Denies: neuropathy Last eye exam: Completed 11/28/ 2023  Macrovascular complications:   Denies: CAD, PVD, CVA   PAST HISTORY: Past Medical History:  Past Medical History:  Diagnosis Date   Diabetes mellitus without complication (HCC)    Past Surgical History:  Past Surgical History:  Procedure Laterality Date   AMPUTATION TOE Left 03/13/2021   Procedure: AMPUTATION BIG TOE;  Surgeon: Candelaria Stagers, DPM;  Location: [facility] ORS;  Service: Podiatry;  Laterality: Left;   COCHLEAR IMPLANT  05/27/2020   [facility]    Social History:  reports that he has never smoked. He has never used smokeless tobacco. He reports that he does not currently use alcohol. He reports that he does not currently use drugs. Family History:  Family History  Problem Relation Age of Onset   Diabetes Father      HOME MEDICATIONS: Allergies as of 12/21/2021  No Known Allergies      Medication List        Accurate as of December 21, 2021 11:32 AM. If you have any questions, ask your nurse or doctor.          STOP taking these medications    atorvastatin 40 MG tablet Commonly known as: LIPITOR Stopped by: Dorita Sciara, MD   pantoprazole 40 MG tablet Commonly known as: PROTONIX Stopped by: Dorita Sciara, MD   ranitidine 150 MG tablet Commonly known as: ZANTAC Stopped by: Dorita Sciara, MD       TAKE these medications    acetaminophen 325 MG tablet Commonly known as: TYLENOL Take 2 tablets (650 mg total) by mouth every 6 (six) hours as needed for mild pain (or Fever >/= 101).   Dexcom G6 Sensor Misc 1 each See admin instructions.   Dexcom G6 Transmitter Misc 1 each.   ferrous sulfate 325 (65 FE) MG tablet Take 325 mg by mouth daily with breakfast.   insulin aspart 100 UNIT/ML injection Commonly known as: novoLOG Insulin pump as you have been directed to use   rosuvastatin  20 MG tablet Commonly known as: CRESTOR Take 20 mg by mouth at bedtime.         ALLERGIES: No Known Allergies   REVIEW OF SYSTEMS: A comprehensive ROS was conducted with the patient and is negative except as per HPI and below:  Review of Systems  Eyes:  Positive for blurred vision.  Gastrointestinal:  Positive for diarrhea. Negative for nausea and vomiting.  Neurological:  Negative for tingling.      OBJECTIVE:   VITAL SIGNS: BP (!) 140/84 (BP Location: Left Arm, Patient Position: Sitting, Cuff Size: Small)   Pulse 81   Ht 6\' 2"  (1.88 m)   Wt 173 lb (78.5 kg)   SpO2 98%   BMI 22.21 kg/m    PHYSICAL EXAM:  General: Pt appears well and is in NAD  Neck: General: Supple without adenopathy or carotid bruits. Thyroid: Thyroid size normal.  No goiter or nodules appreciated.   Lungs: Clear with good BS bilat with no rales, rhonchi, or wheezes  Heart: RRR   Abdomen:  soft, nontender  Extremities: No pretibial edema, pt with scabs over the shins  Neuro: MS is good with appropriate affect, pt is alert and Ox3    DM foot exam: 12/21/2021   The skin of the feet is without sores or ulcerations, S/P left great toe amputation The pedal pulses are 2+ on right and 2+ on left. The sensation is intact to a screening 5.07, 10 gram monofilament bilaterally  DATA REVIEWED:  Lab Results  Component Value Date   HGBA1C 9.2 (H) 03/12/2021   HGBA1C 8.3 (H) 10/27/2020    10/20/2021 BUN 39 Cr. 1.730 GFR 50 HDL 52 LDL 95 Tg 132  ASSESSMENT / PLAN / RECOMMENDATIONS:   1) Type 1 Diabetes Mellitus, Poorly controlled, With CKD III complications - Most recent A1c of 8.1 %. Goal A1c < 7.0 %.     GENERAL: I have discussed with the patient the pathophysiology of diabetes. I explained the complications associated with diabetes including retinopathy, nephropathy, neuropathy as well as increased risk of cardiovascular disease. We went over the benefit seen with glycemic control.  I  explained to the patient that diabetic patients are at higher than normal risk for amputations.  We have not been able to download his tandem pump nor Dexcom today I am not going to  make any changes at this time but the patient was encouraged to start counting his carbohydrates again and entering them with each meal, as he has been depending on the IQ technology to make the adjustments for him but I did explain to the patient that this usually takes longer hence the above goal A1c  MEDICATIONS: NovoLog  EDUCATION / INSTRUCTIONS: BG monitoring instructions: Patient is instructed to check his blood sugars 3 times a day, before meals . Call [facility] if: BG persistently < 70  I reviewed the Rule of 15 for the treatment of hypoglycemia in detail with the patient. Literature supplied.   2) Diabetic complications:  Eye: Does  have known diabetic retinopathy.  Neuro/ Feet: Does not have known diabetic peripheral neuropathy. Renal: Patient does  have known baseline CKD. He is not on an ACEI/ARB at present.  3) Dyslipidemia :   - LDL acceptable  - Continue Rosuvastatin 20 mg daily   4)CKD III:  -Patient recalls being on lisinopril in the past but he does not recall the reason for discontinuation -I will start him on losartan as below  Medication  Start losartan 25 mg daily  Follow-up in 6 months Signed electronically by: Mack Guise, MD  [facility]  [address] [place], [place] [address] Phone: [phone] FAX: [phone]   CC: Pcp, No No address on file Phone: None  Fax: None    Return to Endocrinology clinic as below: Future Appointments  Date Time Provider [place]  12/28/2021  2:45 PM Bernarda Caffey, MD [facility] None

## 2021-12-21 ENCOUNTER — Encounter: Payer: Self-pay | Admitting: Internal Medicine

## 2021-12-21 ENCOUNTER — Ambulatory Visit (INDEPENDENT_AMBULATORY_CARE_PROVIDER_SITE_OTHER): Payer: Managed Care, Other (non HMO) | Admitting: Internal Medicine

## 2021-12-21 VITALS — BP 140/84 | HR 81 | Ht 74.0 in | Wt 173.0 lb

## 2021-12-21 DIAGNOSIS — E1065 Type 1 diabetes mellitus with hyperglycemia: Secondary | ICD-10-CM | POA: Diagnosis not present

## 2021-12-21 DIAGNOSIS — E1022 Type 1 diabetes mellitus with diabetic chronic kidney disease: Secondary | ICD-10-CM | POA: Diagnosis not present

## 2021-12-21 DIAGNOSIS — N1831 Chronic kidney disease, stage 3a: Secondary | ICD-10-CM

## 2021-12-21 DIAGNOSIS — E103593 Type 1 diabetes mellitus with proliferative diabetic retinopathy without macular edema, bilateral: Secondary | ICD-10-CM

## 2021-12-21 LAB — POCT GLYCOSYLATED HEMOGLOBIN (HGB A1C): Hemoglobin A1C: 8.1 % — AB (ref 4.0–5.6)

## 2021-12-21 MED ORDER — INSULIN ASPART 100 UNIT/ML IJ SOLN: INTRAMUSCULAR | 3 refills | Status: DC

## 2021-12-21 MED ORDER — LOSARTAN POTASSIUM 25 MG PO TABS: 25.0000 mg | ORAL_TABLET | Freq: Every day | ORAL | 3 refills | Status: DC

## 2021-12-21 NOTE — Patient Instructions (Signed)

## 2021-12-23 DIAGNOSIS — Z419 Encounter for procedure for purposes other than remedying health state, unspecified: Secondary | ICD-10-CM | POA: Diagnosis not present

## 2021-12-27 NOTE — Progress Notes (Incomplete)
[facility] - Clinic Note  12/28/2021     CHIEF COMPLAINT Patient presents for No chief complaint on file.   HISTORY OF PRESENT ILLNESS: James [name] is a 41 y.o. male who presents to the clinic today for:     Referring physician: No referring provider defined for this encounter.  HISTORICAL INFORMATION:   Selected notes from the MEDICAL RECORD NUMBER Referred by Dr. Santiago Bumpers for vitreous hemorrhage LEE:  Ocular Hx- PMH-    CURRENT MEDICATIONS: No current outpatient medications on file. (Ophthalmic Drugs)   No current facility-administered medications for this visit. (Ophthalmic Drugs)   Current Outpatient Medications (Other)  Medication Sig   acetaminophen (TYLENOL) 325 MG tablet Take 2 tablets (650 mg total) by mouth every 6 (six) hours as needed for mild pain (or Fever >/= 101). (Patient not taking: Reported on 12/21/2021)   Continuous Blood Gluc Sensor (DEXCOM G6 SENSOR) MISC 1 each See admin instructions.   Continuous Blood Gluc Transmit (DEXCOM G6 TRANSMITTER) MISC 1 each.   ferrous sulfate 325 (65 FE) MG tablet Take 325 mg by mouth daily with breakfast. (Patient not taking: Reported on 12/21/2021)   insulin aspart (NOVOLOG) 100 UNIT/ML injection Max daily 30 units  per pump   losartan (COZAAR) 25 MG tablet Take 1 tablet (25 mg total) by mouth daily.   rosuvastatin (CRESTOR) 20 MG tablet Take 20 mg by mouth at bedtime.   No current facility-administered medications for this visit. (Other)   REVIEW OF SYSTEMS:   ALLERGIES No Known Allergies  PAST MEDICAL HISTORY Past Medical History:  Diagnosis Date   Diabetes mellitus without complication [facility])    Past Surgical History:  Procedure Laterality Date   AMPUTATION TOE Left 03/13/2021   Procedure: AMPUTATION BIG TOE;  Surgeon: Candelaria Stagers, DPM;  Location: [facility] ORS;  Service: Podiatry;  Laterality: Left;   COCHLEAR IMPLANT  05/27/2020   [facility]   FAMILY HISTORY Family History  Problem  Relation Age of Onset   Diabetes Father    SOCIAL HISTORY Social History   Tobacco Use   Smoking status: Never   Smokeless tobacco: Never  Vaping Use   Vaping Use: Never used  Substance Use Topics   Alcohol use: Not Currently   Drug use: Not Currently       OPHTHALMIC EXAM:  Not recorded    IMAGING AND PROCEDURES  Imaging and Procedures for 12/28/2021          ASSESSMENT/PLAN:  No diagnosis found.   1,2. 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, [place] -- moved to [place] area recently  - history of PRP OU  - pt presented 9.28.23 with 2 wk history of decreased vision OD -- saw Dr. Santiago Bumpers who then referred here. - FA (09.28.23) shows OD: Focal blockage along distal IT arcades corresponding to pre-retinal heme, scattered patches of NVE 360, mild NVD; OS: Focal patch of MA, vascular non-perfusion and NVE temporal macula -- pt would likely benefit from some fill in PRP  - s/p IVA OU #1 (10.03.23), #2 (10.31.23), #3 (11.28.23)  - s/p fill in PRP OS (10.17.23) - exam shows mild diffuse VH OD -- improving and clear enough for fill in PRP OD; good posterior PRP fill in OS - BCVA OD stable at 20/25 OD, 20/20 OS - OCT shows OD: PRF inferior and temporal midzone -- slightly improved, mild interval improvement in vit  opacities inferiorly - discussed findings and prognosis - recommend PRP fill in OD  - pt wishes to proceed with laser - RBA of procedure discussed, questions answered - informed consent obtained and signed - see procedure note - VH precautions reviewed -- minimize activities, keep head elevated, avoid ASA/NSAIDs/blood thinners as able - Lotemax QID OD x 5-7 days - f/u 4-5 weeks for injection(s) (~December 26)  3,4. Hypertensive retinopathy OU - discussed importance of tight BP control - monitor  Ophthalmic Meds Ordered this visit:  No orders of the defined types were  placed in this encounter.    No follow-ups on file.  There are no Patient Instructions on file for this visit.   Explained the diagnoses, plan, and follow up with the patient and they expressed understanding.  Patient expressed understanding of the importance of proper follow up care.   This document serves as a record of services personally performed by Karie Chimera, MD, PhD. It was created on their behalf by De Blanch, an ophthalmic technician. The creation of this record is the provider's dictation and/or activities during the visit.    Electronically signed by: De Blanch, OA, 12/27/21  11:14 AM   Karie Chimera, M.D., Ph.D. Diseases & Surgery of the Retina and Vitreous [facility]    Abbreviations: M myopia (nearsighted); A astigmatism; H hyperopia (farsighted); P presbyopia; Mrx spectacle prescription;  CTL contact lenses; OD right eye; OS left eye; OU both eyes  XT exotropia; ET esotropia; PEK punctate epithelial keratitis; PEE punctate epithelial erosions; DES dry eye syndrome; MGD meibomian gland dysfunction; ATs artificial tears; PFAT's preservative free artificial tears; NSC nuclear sclerotic cataract; PSC posterior subcapsular cataract; ERM epi-retinal membrane; PVD posterior vitreous detachment; RD retinal detachment; DM diabetes mellitus; DR diabetic retinopathy; NPDR non-proliferative diabetic retinopathy; PDR proliferative diabetic retinopathy; CSME clinically significant macular edema; DME diabetic macular edema; dbh dot blot hemorrhages; CWS cotton wool spot; POAG primary open angle glaucoma; C/D cup-to-disc ratio; HVF humphrey visual field; GVF goldmann visual field; OCT optical coherence tomography; IOP intraocular pressure; BRVO Branch retinal vein occlusion; CRVO central retinal vein occlusion; CRAO central retinal artery occlusion; BRAO branch retinal artery occlusion; RT retinal tear; SB scleral buckle; PPV pars plana vitrectomy;  VH Vitreous hemorrhage; PRP panretinal laser photocoagulation; IVK intravitreal kenalog; VMT vitreomacular traction; MH Macular hole;  NVD neovascularization of the disc; NVE neovascularization elsewhere; AREDS age related eye disease study; ARMD age related macular degeneration; POAG primary open angle glaucoma; EBMD epithelial/anterior basement membrane dystrophy; ACIOL anterior chamber intraocular lens; IOL intraocular lens; PCIOL posterior chamber intraocular lens; Phaco/IOL phacoemulsification with intraocular lens placement; PRK photorefractive keratectomy; LASIK laser assisted in situ keratomileusis; HTN hypertension; DM diabetes mellitus; COPD chronic obstructive pulmonary disease

## 2021-12-28 ENCOUNTER — Encounter (INDEPENDENT_AMBULATORY_CARE_PROVIDER_SITE_OTHER): Payer: Managed Care, Other (non HMO) | Admitting: Ophthalmology

## 2021-12-28 DIAGNOSIS — H4311 Vitreous hemorrhage, right eye: Secondary | ICD-10-CM

## 2021-12-28 DIAGNOSIS — I10 Essential (primary) hypertension: Secondary | ICD-10-CM

## 2021-12-28 DIAGNOSIS — H35033 Hypertensive retinopathy, bilateral: Secondary | ICD-10-CM

## 2021-12-28 DIAGNOSIS — E113593 Type 2 diabetes mellitus with proliferative diabetic retinopathy without macular edema, bilateral: Secondary | ICD-10-CM

## 2022-01-23 DIAGNOSIS — Z419 Encounter for procedure for purposes other than remedying health state, unspecified: Secondary | ICD-10-CM | POA: Diagnosis not present

## 2022-02-23 DIAGNOSIS — Z419 Encounter for procedure for purposes other than remedying health state, unspecified: Secondary | ICD-10-CM | POA: Diagnosis not present

## 2022-03-24 DIAGNOSIS — Z419 Encounter for procedure for purposes other than remedying health state, unspecified: Secondary | ICD-10-CM | POA: Diagnosis not present

## 2022-04-10 ENCOUNTER — Observation Stay
Admission: EM | Admit: 2022-04-10 | Discharge: 2022-04-11 | Disposition: A | Discharge status: Home / Self Care | Payer: 59 | Attending: Internal Medicine | Admitting: Internal Medicine

## 2022-04-10 ENCOUNTER — Encounter: Payer: Self-pay | Admitting: Internal Medicine

## 2022-04-10 ENCOUNTER — Emergency Department: Payer: 59

## 2022-04-10 DIAGNOSIS — R5383 Other fatigue: Secondary | ICD-10-CM | POA: Diagnosis present

## 2022-04-10 DIAGNOSIS — E1010 Type 1 diabetes mellitus with ketoacidosis without coma: Secondary | ICD-10-CM | POA: Diagnosis not present

## 2022-04-10 DIAGNOSIS — E785 Hyperlipidemia, unspecified: Secondary | ICD-10-CM | POA: Diagnosis present

## 2022-04-10 DIAGNOSIS — E1022 Type 1 diabetes mellitus with diabetic chronic kidney disease: Secondary | ICD-10-CM | POA: Diagnosis present

## 2022-04-10 DIAGNOSIS — Z794 Long term (current) use of insulin: Secondary | ICD-10-CM

## 2022-04-10 DIAGNOSIS — I10 Essential (primary) hypertension: Secondary | ICD-10-CM | POA: Diagnosis present

## 2022-04-10 DIAGNOSIS — N189 Chronic kidney disease, unspecified: Secondary | ICD-10-CM | POA: Diagnosis present

## 2022-04-10 DIAGNOSIS — Z89422 Acquired absence of other left toe(s): Secondary | ICD-10-CM

## 2022-04-10 DIAGNOSIS — Z9641 Presence of insulin pump (external) (internal): Secondary | ICD-10-CM | POA: Diagnosis present

## 2022-04-10 DIAGNOSIS — Z79899 Other long term (current) drug therapy: Secondary | ICD-10-CM | POA: Diagnosis not present

## 2022-04-10 DIAGNOSIS — Z9621 Cochlear implant status: Secondary | ICD-10-CM | POA: Diagnosis present

## 2022-04-10 DIAGNOSIS — R7889 Finding of other specified substances, not normally found in blood: Secondary | ICD-10-CM | POA: Diagnosis not present

## 2022-04-10 DIAGNOSIS — E103593 Type 1 diabetes mellitus with proliferative diabetic retinopathy without macular edema, bilateral: Secondary | ICD-10-CM | POA: Diagnosis present

## 2022-04-10 DIAGNOSIS — E860 Dehydration: Secondary | ICD-10-CM

## 2022-04-10 DIAGNOSIS — E1043 Type 1 diabetes mellitus with diabetic autonomic (poly)neuropathy: Secondary | ICD-10-CM | POA: Diagnosis not present

## 2022-04-10 DIAGNOSIS — E1065 Type 1 diabetes mellitus with hyperglycemia: Secondary | ICD-10-CM | POA: Diagnosis not present

## 2022-04-10 DIAGNOSIS — Z833 Family history of diabetes mellitus: Secondary | ICD-10-CM | POA: Diagnosis not present

## 2022-04-10 DIAGNOSIS — I959 Hypotension, unspecified: Secondary | ICD-10-CM | POA: Diagnosis present

## 2022-04-10 DIAGNOSIS — E10319 Type 1 diabetes mellitus with unspecified diabetic retinopathy without macular edema: Secondary | ICD-10-CM | POA: Diagnosis present

## 2022-04-10 DIAGNOSIS — D631 Anemia in chronic kidney disease: Secondary | ICD-10-CM | POA: Diagnosis present

## 2022-04-10 DIAGNOSIS — R531 Weakness: Secondary | ICD-10-CM | POA: Diagnosis not present

## 2022-04-10 DIAGNOSIS — E1110 Type 2 diabetes mellitus with ketoacidosis without coma: Secondary | ICD-10-CM | POA: Diagnosis present

## 2022-04-10 DIAGNOSIS — N179 Acute kidney failure, unspecified: Secondary | ICD-10-CM | POA: Insufficient documentation

## 2022-04-10 DIAGNOSIS — N1831 Chronic kidney disease, stage 3a: Secondary | ICD-10-CM | POA: Diagnosis present

## 2022-04-10 DIAGNOSIS — K3184 Gastroparesis: Secondary | ICD-10-CM | POA: Diagnosis present

## 2022-04-10 DIAGNOSIS — D638 Anemia in other chronic diseases classified elsewhere: Secondary | ICD-10-CM | POA: Diagnosis present

## 2022-04-10 DIAGNOSIS — I129 Hypertensive chronic kidney disease with stage 1 through stage 4 chronic kidney disease, or unspecified chronic kidney disease: Secondary | ICD-10-CM | POA: Diagnosis present

## 2022-04-10 LAB — COMPREHENSIVE METABOLIC PANEL
ALT: 24 U/L (ref 0–44)
AST: 16 U/L (ref 15–41)
Albumin: 3.7 g/dL (ref 3.5–5.0)
Alkaline Phosphatase: 108 U/L (ref 38–126)
Anion gap: 16 — ABNORMAL HIGH (ref 5–15)
BUN: 39 mg/dL — ABNORMAL HIGH (ref 6–20)
CO2: 23 mmol/L (ref 22–32)
Calcium: 9.3 mg/dL (ref 8.9–10.3)
Chloride: 97 mmol/L — ABNORMAL LOW (ref 98–111)
Creatinine, Ser: 2.38 mg/dL — ABNORMAL HIGH (ref 0.61–1.24)
GFR, Estimated: 34 mL/min — ABNORMAL LOW (ref >60)
Glucose, Bld: 299 mg/dL — ABNORMAL HIGH (ref 70–99)
Potassium: 4.6 mmol/L (ref 3.5–5.1)
Sodium: 136 mmol/L (ref 135–145)
Total Bilirubin: 0.7 mg/dL (ref 0.3–1.2)
Total Protein: 7.7 g/dL (ref 6.5–8.1)

## 2022-04-10 LAB — CBC WITH DIFFERENTIAL/PLATELET
Abs Immature Granulocytes: 0.01 10*3/uL (ref 0.00–0.07)
Basophils Absolute: 0 10*3/uL (ref 0.0–0.1)
Basophils Relative: 0 %
Eosinophils Absolute: 0.1 10*3/uL (ref 0.0–0.5)
Eosinophils Relative: 1 %
HCT: 40.9 % (ref 39.0–52.0)
Hemoglobin: 13.1 g/dL (ref 13.0–17.0)
Immature Granulocytes: 0 %
Lymphocytes Relative: 13 %
Lymphs Abs: 0.8 10*3/uL (ref 0.7–4.0)
MCH: 26.3 pg (ref 26.0–34.0)
MCHC: 32 g/dL (ref 30.0–36.0)
MCV: 82.1 fL (ref 80.0–100.0)
Monocytes Absolute: 0.4 10*3/uL (ref 0.1–1.0)
Monocytes Relative: 6 %
Neutro Abs: 4.7 10*3/uL (ref 1.7–7.7)
Neutrophils Relative %: 80 %
Platelets: 199 10*3/uL (ref 150–400)
RBC: 4.98 MIL/uL (ref 4.22–5.81)
RDW: 14 % (ref 11.5–15.5)
WBC: 5.9 10*3/uL (ref 4.0–10.5)
nRBC: 0 % (ref 0.0–0.2)

## 2022-04-10 LAB — URINALYSIS, ROUTINE W REFLEX MICROSCOPIC
Bacteria, UA: NONE SEEN
Bilirubin Urine: NEGATIVE
Glucose, UA: >=500 mg/dL — AB
Hgb urine dipstick: NEGATIVE
Ketones, ur: NEGATIVE mg/dL
Leukocytes,Ua: NEGATIVE
Nitrite: NEGATIVE
Protein, ur: 30 mg/dL — AB
Specific Gravity, Urine: 1.021 (ref 1.005–1.030)
Squamous Epithelial / HPF: NONE SEEN /HPF (ref 0–5)
pH: 5 (ref 5.0–8.0)

## 2022-04-10 LAB — BASIC METABOLIC PANEL
Anion gap: 9 (ref 5–15)
BUN: 40 mg/dL — ABNORMAL HIGH (ref 6–20)
CO2: 25 mmol/L (ref 22–32)
Calcium: 8.5 mg/dL — ABNORMAL LOW (ref 8.9–10.3)
Chloride: 100 mmol/L (ref 98–111)
Creatinine, Ser: 2.16 mg/dL — ABNORMAL HIGH (ref 0.61–1.24)
GFR, Estimated: 38 mL/min — ABNORMAL LOW (ref >60)
Glucose, Bld: 320 mg/dL — ABNORMAL HIGH (ref 70–99)
Potassium: 4 mmol/L (ref 3.5–5.1)
Sodium: 134 mmol/L — ABNORMAL LOW (ref 135–145)

## 2022-04-10 LAB — BETA-HYDROXYBUTYRIC ACID: Beta-Hydroxybutyric Acid: 0.52 mmol/L — ABNORMAL HIGH (ref 0.05–0.27)

## 2022-04-10 LAB — LACTIC ACID, PLASMA
Lactic Acid, Venous: 2.1 mmol/L (ref 0.5–1.9)
Lactic Acid, Venous: 1.6 mmol/L (ref 0.5–1.9)

## 2022-04-10 LAB — PROTIME-INR
INR: 1 (ref 0.8–1.2)
Prothrombin Time: 13.5 seconds (ref 11.4–15.2)

## 2022-04-10 LAB — CBG MONITORING, ED
Glucose-Capillary: 298 mg/dL — ABNORMAL HIGH (ref 70–99)
Glucose-Capillary: 200 mg/dL — ABNORMAL HIGH (ref 70–99)

## 2022-04-10 LAB — CK: Total CK: 130 U/L (ref 49–397)

## 2022-04-10 LAB — TROPONIN I (HIGH SENSITIVITY): Troponin I (High Sensitivity): <2 ng/L (ref <18)

## 2022-04-10 MED ORDER — HYDRALAZINE HCL 10 MG PO TABS: 10.0000 mg | ORAL_TABLET | Freq: Four times a day (QID) | ORAL | Status: DC | PRN

## 2022-04-10 MED ORDER — LACTATED RINGERS IV SOLN: INTRAVENOUS | Status: DC

## 2022-04-10 MED ORDER — DEXTROSE IN LACTATED RINGERS 5 % IV SOLN: INTRAVENOUS | Status: DC

## 2022-04-10 MED ORDER — HEPARIN SODIUM (PORCINE) 5000 UNIT/ML IJ SOLN
5000.0000 [IU] | Freq: Three times a day (TID) | INTRAMUSCULAR | Status: DC
  Administered 2022-04-10 – 2022-04-11 (×2): 5000 [IU] via SUBCUTANEOUS
  Filled 2022-04-10 (×2): qty 1

## 2022-04-10 MED ORDER — HYDRALAZINE HCL 20 MG/ML IJ SOLN: 5.0000 mg | Freq: Three times a day (TID) | INTRAMUSCULAR | Status: DC | PRN

## 2022-04-10 MED ORDER — POTASSIUM CHLORIDE 10 MEQ/100ML IV SOLN
10.0000 meq | INTRAVENOUS | Status: AC
  Administered 2022-04-10 (×2): 10 meq via INTRAVENOUS
  Filled 2022-04-10 (×2): qty 100

## 2022-04-10 MED ORDER — INSULIN REGULAR(HUMAN) IN NACL 100-0.9 UT/100ML-% IV SOLN
INTRAVENOUS | Status: DC
  Administered 2022-04-10: 8 [IU]/h via INTRAVENOUS
  Administered 2022-04-11: 3.4 [IU]/h via INTRAVENOUS
  Filled 2022-04-10: qty 100

## 2022-04-10 MED ORDER — DEXTROSE 50 % IV SOLN: 0.0000 mL | INTRAVENOUS | Status: DC | PRN

## 2022-04-10 MED ORDER — SODIUM CHLORIDE 0.9 % IV BOLUS
1000.0000 mL | Freq: Once | INTRAVENOUS | Status: AC
  Administered 2022-04-10: 1000 mL via INTRAVENOUS

## 2022-04-10 MED ORDER — FERROUS GLUCONATE 324 (38 FE) MG PO TABS: 324.0000 mg | ORAL_TABLET | Freq: Three times a day (TID) | ORAL | Status: DC

## 2022-04-10 MED ORDER — METOCLOPRAMIDE HCL 5 MG/ML IJ SOLN: 5.0000 mg | Freq: Four times a day (QID) | INTRAMUSCULAR | Status: DC | PRN

## 2022-04-10 MED ORDER — FERROUS GLUCONATE 324 (38 FE) MG PO TABS: 324.0000 mg | ORAL_TABLET | ORAL | Status: DC

## 2022-04-10 MED ORDER — ROSUVASTATIN CALCIUM 20 MG PO TABS
20.0000 mg | ORAL_TABLET | Freq: Every day | ORAL | Status: DC
  Administered 2022-04-10: 20 mg via ORAL
  Filled 2022-04-10: qty 1

## 2022-04-10 MED ORDER — METOCLOPRAMIDE HCL 10 MG PO TABS: 5.0000 mg | ORAL_TABLET | Freq: Four times a day (QID) | ORAL | Status: DC | PRN

## 2022-04-10 MED ORDER — SODIUM CHLORIDE 0.9 % IV SOLN: 12.5000 mg | Freq: Three times a day (TID) | INTRAVENOUS | Status: DC | PRN

## 2022-04-10 MED ORDER — LACTATED RINGERS IV BOLUS
20.0000 mL/kg | Freq: Once | INTRAVENOUS | Status: AC
  Administered 2022-04-10: 1542 mL via INTRAVENOUS

## 2022-04-10 NOTE — Assessment & Plan Note (Signed)
-   Rosuvastatin 20 mg nightly resumed 

## 2022-04-10 NOTE — ED Provider Notes (Signed)
[facility] Provider Note    Event Date/Time   First MD Initiated Contact with Patient 04/10/22 2001     (approximate)   History   Fatigue   HPI  James [name] is a 42 y.o. male history of diabetes, has insulin pump  Patient reports that today he went to work he felt fatigued, weak.  Continue to feel this way throughout the day.  Reports similar in the past  His diabetes has been well-controlled reports blood sugars are mostly been running around 100.  He has been continue to use his insulin pump and has not seen any areas or issues, basal rate currently 2.7 units/h  Denies any fevers or chills no pain no nausea no vomiting.  Is hungry at this time.  Reports she does urinate quite a bit though.  He tries to drink and stay hydrated but continues to feel thirsty drinking lots of water recently  No cough no fevers no chills no other symptoms just feels very fatigued and tired.     Physical Exam   Triage Vital Signs: ED Triage Vitals  Enc Vitals Group     BP 04/10/22 1845 (!) 78/40     Pulse Rate 04/10/22 1842 87     Resp 04/10/22 1842 17     Temp 04/10/22 1842 97.8 F (36.6 C)     Temp Source 04/10/22 1842 Oral     SpO2 04/10/22 1842 99 %     Weight 04/10/22 1845 170 lb (77.1 kg)     Height 04/10/22 1845 6\' 2"  (1.88 m)     Head Circumference --      Peak Flow --      Pain Score 04/10/22 1844 0     Pain Loc --      Pain Edu? --      Excl. in [place]? --     Most recent vital signs: Vitals:   04/10/22 1845 04/10/22 2000  BP: (!) 78/40 120/70  Pulse:  86  Resp:  10  Temp:    SpO2:  97%     General: Awake, no distress.  Mucous membranes quite dry CV:  Good peripheral perfusion.  Normal tones and rate Resp:  Normal effort.  Clear bilateral Abd:  No distention.  Soft nontender nondistended.  Insulin infusion site clean dry and intact left lower abdomen Other:  Was extremities well.  Very pleasant.  Fairly hard of hearing.  Accompanied by  his wife, both very pleasant   ED Results / Procedures / Treatments   Labs (all labs ordered are listed, but only abnormal results are displayed) Labs Reviewed  COMPREHENSIVE METABOLIC PANEL - Abnormal; Notable for the following components:      Result Value   Chloride 97 (*)    Glucose, Bld 299 (*)    BUN 39 (*)    Creatinine, Ser 2.38 (*)    GFR, Estimated 34 (*)    Anion gap 16 (*)    All other components within normal limits  LACTIC ACID, PLASMA - Abnormal; Notable for the following components:   Lactic Acid, Venous 2.1 (*)    All other components within normal limits  BETA-HYDROXYBUTYRIC ACID - Abnormal; Notable for the following components:   Beta-Hydroxybutyric Acid 0.52 (*)    All other components within normal limits  CBG MONITORING, ED - Abnormal; Notable for the following components:   Glucose-Capillary 298 (*)    All other components within normal limits  CULTURE, BLOOD (ROUTINE X  2)  CBC WITH DIFFERENTIAL/PLATELET  LACTIC ACID, PLASMA  PROTIME-INR  BASIC METABOLIC PANEL  BASIC METABOLIC PANEL  BASIC METABOLIC PANEL  BETA-HYDROXYBUTYRIC ACID  URINALYSIS, ROUTINE W REFLEX MICROSCOPIC  CBC  CK  HEMOGLOBIN A1C  TROPONIN I (HIGH SENSITIVITY)   Labs are notable for mildly elevated lactic acid.  Anion gap is elevated at 16 but his CO2 is normal.  He does have hyperglycemia with a blood sugar of 299 and acute on chronic renal disease with creatinine notably elevated to approximately 2.4 with significant decrease in his GFR from previous on my interpretation.  No old for comparison.  EKG  Interpreted by me at 1855 heart rate 80 QRS 90 QTc 430 Normal sinus rhythm, early repolarization versus possible LVH with repolarization abnormality favored to be early repolarization.   RADIOLOGY  Chest x-ray interpreted by me as normal    PROCEDURES:  Critical Care performed: No  Procedures   MEDICATIONS ORDERED IN ED: Medications  heparin injection 5,000 Units  (has no administration in time range)  lactated ringers bolus 1,542 mL (has no administration in time range)  insulin regular, human (MYXREDLIN) 100 units/ 100 mL infusion (8 Units/hr Intravenous New Bag/Given 04/10/22 2129)  lactated ringers infusion (has no administration in time range)  dextrose 5 % in lactated ringers infusion ( Intravenous New Bag/Given 04/10/22 2137)  dextrose 50 % solution 0-50 mL (has no administration in time range)  potassium chloride 10 mEq in 100 mL IVPB (has no administration in time range)  hydrALAZINE (APRESOLINE) tablet 10 mg (has no administration in time range)  metoCLOPramide (REGLAN) tablet 5 mg (has no administration in time range)    Or  metoCLOPramide (REGLAN) injection 5 mg (has no administration in time range)  promethazine (PHENERGAN) 12.5 mg in sodium chloride 0.9 % 50 mL IVPB (has no administration in time range)  sodium chloride 0.9 % bolus 1,000 mL (0 mLs Intravenous Stopped 04/10/22 2114)     IMPRESSION / MDM / ASSESSMENT AND PLAN / ED COURSE  I reviewed the triage vital signs and the nursing notes.                              The patient is noted to have a SBP's <90 on 1 shock. With the current information available to me, I don't think the patient is in septic shock. The MAP's <65/ SBP's <90, is related to an acute condition that is not due to an infection most likely dehydration in the setting of hyperglycemia, .   Differential diagnosis includes, but is not limited to, dehydration, hyperglycemia, AKI, DKA, hyperglycemia, anemia, infection etc.  No obvious infectious symptoms.  No chest pain or shortness of breath no acute cardiopulmonary symptoms.  When laying flat the patient's blood pressure normalizes, but at the time of triage though he was notably hypotensive, feels better with laying down.  Suspect likely prerenal he has no associated abdominal pain has a history of mild chronic renal disease upon my review of his historical  labs.  Discussed with the patient, will add beta hydroxybutyrate to evaluate for possible ketosis and await urinalysis but at this point he does not have tachycardia, no tachypnea, no alteration in mental status it is seems clinically unlikely that he is in acute DKA.  Will admit to hospitalist service for further care and management, patient understanding agreeable.  Admitting for concerns of presentation with AKI, hypotension thus far no evidence of sepsis  but awaiting urinalysis.  Afebrile normal white count  Patient's presentation is most consistent with acute complicated illness / injury requiring diagnostic workup.   The patient is on the cardiac monitor to evaluate for evidence of arrhythmia and/or significant heart rate changes.  ----------------------------------------- 9:36 PM on 04/10/2022 ----------------------------------------- Consulted with and patient accepted in admission by Dr. Tobie Poet hospitalist service.  Patient and wife understanding agreeable with plan for admission further workup and care under the hospitalist service is anticipated.   Beta hydroxybutyrate has returned mildly elevated.  Patient is borderline for having DKA but notable as patient does not have reduced CO2.  Further care to the hospitalist service.     FINAL CLINICAL IMPRESSION(S) / ED DIAGNOSES   Final diagnoses:  Dehydration  Hypotension, unspecified hypotension type  Elevated beta-hydroxybutyrate     Rx / DC Orders   ED Discharge Orders     None        Note:  This document was prepared using Dragon voice recognition software and may include unintentional dictation errors.   Delman Kitten, MD 04/10/22 2138

## 2022-04-10 NOTE — Assessment & Plan Note (Signed)
With baseline CKD 3A Status post sodium chloride 1 L bolus per EDP Fluid management per DKA protocol

## 2022-04-10 NOTE — Assessment & Plan Note (Addendum)
At baseline range Resumed home ferrous gluconate 324 mg p.o. every other day

## 2022-04-10 NOTE — Hospital Course (Signed)
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  Vitals in the ED showed temperature of 97.8, respiration rate of 17, heart rate 87, blood pressure 78/40, SpO2 of 99% on room air.  Serum sodium is 136, potassium 4.6, chloride 97, bicarb 23, BUN of 39, serum creatinine of 2.38, EGFR 34, nonfasting blood glucose 299, anion gap elevated at 16, WBC 5.9, hemoglobin 13.1, platelets of 199.  Lactic acid elevated at 2.1.  Blood cultures x 2 are in process.  ED treatment: Sodium chloride 1 L bolus.

## 2022-04-10 NOTE — ED Triage Notes (Signed)
Pt co of dizziness, fatigue, muscle tightness, x 1 day. Pt states he went to go to work and "felt too drained to do anything."

## 2022-04-10 NOTE — H&P (Addendum)
History and Physical   James [name] [id number] DOB: [birth year] DOA: 04/10/2022  PCP: Kathalene Frames, MD  Outpatient Specialists: Dr. Kelton Pillar, endocrinology Patient coming from: home  I have personally briefly reviewed patient's old medical records in [place].  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  Vitals in the ED showed temperature of 97.8, respiration rate of 17, heart rate 87, blood pressure 78/40, SpO2 of 99% on room air.  Serum sodium is 136, potassium 4.6, chloride 97, bicarb 23, BUN of 39, serum creatinine of 2.38, EGFR 34, nonfasting blood glucose 299, anion gap elevated at 16, WBC 5.9, hemoglobin 13.1, platelets of 199.  Lactic acid elevated at 2.1.  Blood cultures x 2 are in process.  ED treatment: Sodium chloride 1 L bolus. ----------------------------- At bedside, patient was able to tell his name, age, current year, current location.  He reports he has been feeling weak and generalized fatigue since yesterday.   He denies trauma, chest pain, shortness of breaht, dysuria, hematuria, diarrhea, swelling of his legs.   He reports he has been using his insulin pump appropriately.   Social history: He denies tobacco, etoh, and recreational drug use. He works for [employer]  ROS: Constitutional: no weight change, no fever ENT/Mouth: no sore throat, no rhinorrhea Eyes: no eye pain, no vision changes Cardiovascular: no chest pain, no dyspnea,  no edema, no palpitations Respiratory: no cough, no sputum, no wheezing Gastrointestinal: no nausea, no vomiting, no diarrhea, no  constipation Genitourinary: no urinary incontinence, no dysuria, no hematuria, + polyuria Musculoskeletal: no arthralgias, + myalgias Skin: no skin lesions, no pruritus, Neuro: + weakness, no loss of consciousness, no syncope Psych: no anxiety, no depression, no decrease appetite Heme/Lymph: no bruising, no bleeding  ED Course: Discussed with EDP, patient requiring hospitalization for hyperglycemia and mild metabolic acidosis.  Assessment/Plan  Principal Problem:   DKA (diabetic ketoacidosis) (HCC) Active Problems:   Gastroparesis   Type 1 diabetes mellitus with diabetic retinopathy ([place])   Essential hypertension   Acute kidney injury superimposed on CKD ([place])   Anemia in other chronic diseases classified elsewhere   Uncontrolled type 1 diabetes mellitus with hyperglycemia, with long-term current use of insulin (HCC)   Type 1 diabetes mellitus with hyperglycemia (HCC)   Hyperlipidemia   Assessment and Plan:  * DKA (diabetic ketoacidosis) ([place]) Etiology workup in progress Possible continuous glucose monitor malfunction, and not reading blood glucose level appropriately Diabetes educator and nurse consulted for further assistance with continuous glucose monitor evaluation DKA protocol initiated with insulin gtt., fluid, potassium supplementation Given patient's age, will check for high sensitive troponin Check a UA Admit to stepdown, inpatient  Hyperlipidemia Rosuvastatin 20 mg nightly resumed  Uncontrolled type 1 diabetes mellitus with hyperglycemia, with long-term current use of insulin ([place]) A1c in 03/12/2021: 9.2 Recheck A1c on admission  Anemia in other chronic diseases classified elsewhere At baseline range Resumed home ferrous gluconate 324 mg p.o. every other day  Acute kidney injury superimposed on CKD ([place]) With baseline CKD 3A Status post sodium chloride 1 L bolus per EDP Fluid management per DKA protocol  Essential hypertension Home losartan 25 mg daily  not resumed on admission due to acute kidney  injury Hydralazine 10 mg p.o. every 6 hours as needed for SBP greater than 175, 4 days ordered  Gastroparesis Metoclopramide 5 mg PO q6h prn nausea or 5 mg IV q6h prn nausea, vomiting, 5 days ordered  Chart reviewed.   DVT prophylaxis: heparin 5000 units, subcutaneous q8h Code Status: full code Diet: NPO Family Communication: no Disposition Plan: pending clinical course Consults called: none at this time Admission status: inpatient, stepdown  Past Medical History:  Diagnosis Date   Diabetes mellitus without complication [facility])    Past Surgical History:  Procedure Laterality Date   AMPUTATION TOE Left 03/13/2021   Procedure: AMPUTATION BIG TOE;  Surgeon: Felipa Furnace, DPM;  Location: [facility] ORS;  Service: Podiatry;  Laterality: Left;   COCHLEAR IMPLANT  05/27/2020   [facility]   Social History:  reports that he has never smoked. He has never used smokeless tobacco. He reports that he does not drink alcohol and does not use drugs.  No Known Allergies Family History  Problem Relation Age of Onset   Diabetes Father    Family history: Family history reviewed and not pertinent  Prior to Admission medications   Medication Sig Start Date End Date Taking? Authorizing Provider  acetaminophen (TYLENOL) 325 MG tablet Take 2 tablets (650 mg total) by mouth every 6 (six) hours as needed for mild pain (or Fever >/= 101). 03/14/21  Yes Wieting, Richard, MD  cholecalciferol (VITAMIN D3) 25 MCG (1000 UNIT) tablet Take 1,000 Units by mouth daily.   Yes [provider]  ferrous gluconate (FERGON) 324 MG tablet Take 324 mg by mouth. Take 1 tablet by mouth 3 (three) times a week 03/23/22  Yes [provider]  insulin aspart (NOVOLOG) 100 UNIT/ML injection Max daily 30 units  per pump 12/21/21  Yes Shamleffer, Melanie Crazier, MD  losartan (COZAAR) 25 MG tablet Take 1 tablet (25 mg total) by mouth daily. 12/21/21  Yes Shamleffer, Melanie Crazier,  MD  Continuous Blood Gluc Sensor (DEXCOM G6 SENSOR) MISC 1 each See admin instructions. 11/28/21   [provider]  Continuous Blood Gluc Transmit (DEXCOM G6 TRANSMITTER) MISC 1 each. 07/28/21   [provider]  ferrous sulfate 325 (65 FE) MG tablet Take 325 mg by mouth daily with breakfast. Patient not taking: Reported on 04/10/2022    [provider]  rosuvastatin (CRESTOR) 20 MG tablet Take 20 mg by mouth at bedtime. 10/24/21   [provider]   Physical Exam: Vitals:   04/10/22 1842 04/10/22 1845 04/10/22 2000 04/10/22 2100  BP:  (!) 78/40 120/70 124/86  Pulse: 87  86 82  Resp: 17  10 18   Temp: 97.8 F (36.6 C)     TempSrc: Oral     SpO2: 99%  97% 100%  Weight:  77.1 kg    Height:  6\' 2"  (1.88 m)     Constitutional: appears age appropriate, NAD, calm, comfortable Eyes: PERRL, lids and conjunctivae normal ENMT: Mucous membranes are moist. Posterior pharynx clear of any exudate or lesions. Age-appropriate dentition. Hearing appropriate Neck: normal, supple, no masses, no thyromegaly Respiratory: clear to auscultation bilaterally, no wheezing, no crackles. Normal respiratory effort. No accessory muscle use.  Cardiovascular: Regular rate and rhythm, no murmurs / rubs / gallops. No extremity edema. 2+ pedal pulses. No carotid bruits.  Abdomen: no tenderness, no masses palpated, no hepatosplenomegaly. Bowel sounds positive.  Musculoskeletal: no clubbing / cyanosis. No joint deformity upper and lower extremities. Good ROM, no contractures, no atrophy. Normal muscle tone.  Skin: no rashes, lesions, ulcers. No induration Neurologic: Sensation intact. Strength 5/5 in all 4.  Psychiatric: Normal judgment and insight. Alert and oriented x 3. Normal mood.   EKG: independently reviewed, showing sinus rhythm with rate of 83, qtc 423  Chest x-ray on Admission: I personally reviewed and I agree with radiologist reading as below.  DG Chest 2 View  Result Date:  04/10/2022 CLINICAL DATA:  Dizziness, fatigue and muscle tightness for 1 day. Suspected sepsis. EXAM: CHEST - 2 VIEW COMPARISON:  None Available. FINDINGS: The cardiac silhouette, mediastinal and hilar contours are normal. The lungs are clear. No pleural effusion. No pulmonary lesions. No pneumothorax. The bony thorax is intact. IMPRESSION: No acute cardiopulmonary findings. Electronically Signed   By: Marijo Sanes M.D.   On: 04/10/2022 20:00    Labs on Admission: I have personally reviewed following labs  CBC: Recent Labs  Lab 04/10/22 1850  WBC 5.9  NEUTROABS 4.7  HGB 13.1  HCT 40.9  MCV 82.1  PLT 123XX123   Basic Metabolic Panel: Recent Labs  Lab 04/10/22 1850 04/10/22 2100  NA 136 134*  K 4.6 4.0  CL 97* 100  CO2 23 25  GLUCOSE 299* 320*  BUN 39* 40*  CREATININE 2.38* 2.16*  CALCIUM 9.3 8.5*   GFR: Estimated Creatinine Clearance: 48.6 mL/min (A) (by C-G formula based on SCr of 2.16 mg/dL (H)).  Liver Function Tests: Recent Labs  Lab 04/10/22 1850  AST 16  ALT 24  ALKPHOS 108  BILITOT 0.7  PROT 7.7  ALBUMIN 3.7   Urine analysis:    Component Value Date/Time   COLORURINE YELLOW (A) 04/10/2022 2130   APPEARANCEUR HAZY (A) 04/10/2022 2130   LABSPEC 1.021 04/10/2022 2130   PHURINE 5.0 04/10/2022 2130   GLUCOSEU >=500 (A) 04/10/2022 2130   HGBUR NEGATIVE 04/10/2022 2130   [place] NEGATIVE 04/10/2022 2130   [place] NEGATIVE 04/10/2022 2130   PROTEINUR 30 (A) 04/10/2022 2130   NITRITE NEGATIVE 04/10/2022 2130   LEUKOCYTESUR NEGATIVE 04/10/2022 2130   CRITICAL CARE Performed by: Dr. Tobie Poet  Total critical care time: 35 minutes  Critical care time was exclusive of separately billable procedures and treating other patients.  Critical care was necessary to treat or prevent imminent or life-threatening deterioration.  Critical care was time spent personally by me on the following activities: development of treatment plan with patient and/or surrogate as well  as nursing, discussions with consultants, evaluation of patient's response to treatment, examination of patient, obtaining history from patient or surrogate, ordering and performing treatments and interventions, ordering and review of laboratory studies, ordering and review of radiographic studies, pulse oximetry and re-evaluation of patient's condition.  This document was prepared using Dragon Voice Recognition software and may include unintentional dictation errors.  Dr. Tobie Poet [facility]  If 7PM-7AM, please contact overnight-coverage provider If 7AM-7PM, please contact day coverage provider [email or web address]  04/10/2022, 10:31 PM

## 2022-04-10 NOTE — Assessment & Plan Note (Addendum)
Etiology workup in progress Possible continuous glucose monitor malfunction, and not reading blood glucose level appropriately Diabetes educator and nurse consulted for further assistance with continuous glucose monitor evaluation DKA protocol initiated with insulin gtt., fluid, potassium supplementation Given patient's age, will check for high sensitive troponin Check a UA Admit to stepdown, inpatient

## 2022-04-10 NOTE — Assessment & Plan Note (Signed)
Metoclopramide 5 mg PO q6h prn nausea or 5 mg IV q6h prn nausea, vomiting, 5 days ordered

## 2022-04-10 NOTE — Assessment & Plan Note (Signed)
A1c in 03/12/2021: 9.2 Recheck A1c on admission

## 2022-04-10 NOTE — ED Notes (Signed)
Pt ambulatory to rm 6. Pt placed on cardiac monitor, call light within reach, family at bedside,  pt has no further needs at this time. Tye Maryland, RN made aware of pt being in rm.

## 2022-04-10 NOTE — ED Notes (Signed)
Pt turned off his home insulin pump upon initiation of our insulin infusion

## 2022-04-10 NOTE — Assessment & Plan Note (Signed)
Home losartan 25 mg daily not resumed on admission due to acute kidney injury Hydralazine 10 mg p.o. every 6 hours as needed for SBP greater than 175, 4 days ordered

## 2022-04-10 NOTE — ED Notes (Signed)
Endo tool and rate change verified by Griffin Basil RN

## 2022-04-11 DIAGNOSIS — I959 Hypotension, unspecified: Secondary | ICD-10-CM

## 2022-04-11 DIAGNOSIS — R7889 Finding of other specified substances, not normally found in blood: Secondary | ICD-10-CM

## 2022-04-11 DIAGNOSIS — E860 Dehydration: Secondary | ICD-10-CM

## 2022-04-11 DIAGNOSIS — E1010 Type 1 diabetes mellitus with ketoacidosis without coma: Secondary | ICD-10-CM | POA: Diagnosis not present

## 2022-04-11 LAB — CBG MONITORING, ED
Glucose-Capillary: 103 mg/dL — ABNORMAL HIGH (ref 70–99)
Glucose-Capillary: 110 mg/dL — ABNORMAL HIGH (ref 70–99)
Glucose-Capillary: 129 mg/dL — ABNORMAL HIGH (ref 70–99)
Glucose-Capillary: 148 mg/dL — ABNORMAL HIGH (ref 70–99)
Glucose-Capillary: 149 mg/dL — ABNORMAL HIGH (ref 70–99)
Glucose-Capillary: 180 mg/dL — ABNORMAL HIGH (ref 70–99)
Glucose-Capillary: 186 mg/dL — ABNORMAL HIGH (ref 70–99)
Glucose-Capillary: 193 mg/dL — ABNORMAL HIGH (ref 70–99)
Glucose-Capillary: 213 mg/dL — ABNORMAL HIGH (ref 70–99)
Glucose-Capillary: 85 mg/dL (ref 70–99)

## 2022-04-11 LAB — BASIC METABOLIC PANEL
Anion gap: 6 (ref 5–15)
Anion gap: 7 (ref 5–15)
Anion gap: 5 (ref 5–15)
BUN: 33 mg/dL — ABNORMAL HIGH (ref 6–20)
BUN: 32 mg/dL — ABNORMAL HIGH (ref 6–20)
BUN: 29 mg/dL — ABNORMAL HIGH (ref 6–20)
CO2: 26 mmol/L (ref 22–32)
CO2: 26 mmol/L (ref 22–32)
CO2: 28 mmol/L (ref 22–32)
Calcium: 8.5 mg/dL — ABNORMAL LOW (ref 8.9–10.3)
Calcium: 8.5 mg/dL — ABNORMAL LOW (ref 8.9–10.3)
Calcium: 8.8 mg/dL — ABNORMAL LOW (ref 8.9–10.3)
Chloride: 105 mmol/L (ref 98–111)
Chloride: 104 mmol/L (ref 98–111)
Chloride: 107 mmol/L (ref 98–111)
Creatinine, Ser: 1.58 mg/dL — ABNORMAL HIGH (ref 0.61–1.24)
Creatinine, Ser: 1.39 mg/dL — ABNORMAL HIGH (ref 0.61–1.24)
Creatinine, Ser: 1.4 mg/dL — ABNORMAL HIGH (ref 0.61–1.24)
GFR, Estimated: 56 mL/min — ABNORMAL LOW (ref >60)
GFR, Estimated: >60 mL/min (ref >60)
GFR, Estimated: >60 mL/min (ref >60)
Glucose, Bld: 125 mg/dL — ABNORMAL HIGH (ref 70–99)
Glucose, Bld: 206 mg/dL — ABNORMAL HIGH (ref 70–99)
Glucose, Bld: 137 mg/dL — ABNORMAL HIGH (ref 70–99)
Potassium: 4.2 mmol/L (ref 3.5–5.1)
Potassium: 3.9 mmol/L (ref 3.5–5.1)
Potassium: 4.3 mmol/L (ref 3.5–5.1)
Sodium: 137 mmol/L (ref 135–145)
Sodium: 137 mmol/L (ref 135–145)
Sodium: 140 mmol/L (ref 135–145)

## 2022-04-11 LAB — HEMOGLOBIN A1C
Hgb A1c MFr Bld: 10.4 % — ABNORMAL HIGH (ref 4.8–5.6)
Mean Plasma Glucose: 252 mg/dL

## 2022-04-11 LAB — BETA-HYDROXYBUTYRIC ACID
Beta-Hydroxybutyric Acid: 0.46 mmol/L — ABNORMAL HIGH (ref 0.05–0.27)
Beta-Hydroxybutyric Acid: 0.1 mmol/L (ref 0.05–0.27)

## 2022-04-11 LAB — CBC
HCT: 38.1 % — ABNORMAL LOW (ref 39.0–52.0)
Hemoglobin: 11.9 g/dL — ABNORMAL LOW (ref 13.0–17.0)
MCH: 25.7 pg — ABNORMAL LOW (ref 26.0–34.0)
MCHC: 31.2 g/dL (ref 30.0–36.0)
MCV: 82.3 fL (ref 80.0–100.0)
Platelets: 175 10*3/uL (ref 150–400)
RBC: 4.63 MIL/uL (ref 4.22–5.81)
RDW: 13.9 % (ref 11.5–15.5)
WBC: 6.2 10*3/uL (ref 4.0–10.5)
nRBC: 0 % (ref 0.0–0.2)

## 2022-04-11 LAB — TROPONIN I (HIGH SENSITIVITY): Troponin I (High Sensitivity): 3 ng/L (ref <18)

## 2022-04-11 MED ORDER — INSULIN PUMP: SUBCUTANEOUS | Status: DC

## 2022-04-11 MED ORDER — INSULIN PUMP
Freq: Three times a day (TID) | SUBCUTANEOUS | Status: DC
  Filled 2022-04-11: qty 1

## 2022-04-11 NOTE — ED Notes (Signed)
Pt turned his insulin pump on per diabetes coordinator and MD Manuella Ghazi. Will come in at 11am to turn off insulin gtt through IV.

## 2022-04-11 NOTE — Inpatient Diabetes Management (Addendum)
Inpatient Diabetes Program Recommendations  AACE/ADA: New Consensus Statement on Inpatient Glycemic Control (2015)  Target Ranges:  Prepandial:   less than 140 mg/dL      Peak postprandial:   less than 180 mg/dL (1-2 hours)      Critically ill patients:  140 - 180 mg/dL    Latest Reference Range & Units 12/21/21 11:39  Hemoglobin A1C 4.0 - 5.6 % 8.1 !  !: Data is abnormal  Latest Reference Range & Units 04/10/22 18:50  Sodium 135 - 145 mmol/L 136  Potassium 3.5 - 5.1 mmol/L 4.6  Chloride 98 - 111 mmol/L 97 (L)  CO2 22 - 32 mmol/L 23  Glucose 70 - 99 mg/dL 299 (H)  BUN 6 - 20 mg/dL 39 (H)  Creatinine 0.61 - 1.24 mg/dL 2.38 (H)  Calcium 8.9 - 10.3 mg/dL 9.3  Anion gap 5 - 15  16 (H)  (L): Data is abnormally low (H): Data is abnormally high  Latest Reference Range & Units 04/10/22 21:14 04/10/22 22:44 04/11/22 00:01 04/11/22 01:05 04/11/22 02:34 04/11/22 03:35 04/11/22 05:14 04/11/22 06:25  Glucose-Capillary 70 - 99 mg/dL 298 (H)  IV Insulin Drip Started 200 (H) 103 (H) 85  IV Insulin Drip Stopped 110 (H) 148 (H) 213 (H) 186 (H)  (H): Data is abnormally high   Admit with: DKA  History: Type 1 Diabetes (dxd at age 42), CKD  Home DM Meds: Dexcom G6       Tandem Insulin Pump with Novolog  Current Orders: IV Insulin Drip   Met w/ pt at bedside in the ED.  Pt A&O and able to show me his Insulin pump settings.  Pt has Tandem (Tslim) pump with the Dexcom G6 CGM.  Pump is currently suspended on delivery of insulin b/c pt getting IV Insulin Drip.  Pump settings match the below settings.  Tubing looked intact and there were no kinks visible in the tubing--Insertion site was intact (L Upper Abd) and no signs/symptoms of infection--Dexcom CGM sensor was attached to R side Abd and appeared intact and OK.  Discussed w/ pt that I will review pt's current labs with the MD and see if I can secure orders from the MD to have pt resume his pump and then we can d/c the IV Insulin Drip.    5am  BMET showed Anion Gap was 7 and CO2 level was 26.  CBGs 180 and less.  Communicated w/ Dr. Manuella Ghazi and got orders to have pt resume his home insulin pump, continue the IV Insulin Drip for 1 hour after the pump restarted, and then d/c the IV Insulin Drip.  Will have RN check CBGs with hospital meter until pt discharges home.  Insulin Pump orders placed and reviewed transition plan with Lanelle Bal, RN.     ENDO: Dr. Kelton Pillar with [facility] Last Seen 12/21/2021 Insulin Pump Settings were as follows: Pump   Tandem   Settings   Insulin type   Novolog     Basal rate          0000 0.675 u/h     0600 0.875    1700 0.79    2130 0.75                I:C ratio          0000 1:15                              Sensitivity  0000  50         Goal          0000  110          --Will follow patient during hospitalization--  Wyn Quaker RN, MSN, [place] Diabetes Coordinator Inpatient Glycemic Control Team Team Pager: [phone] (8a-5p)

## 2022-04-11 NOTE — ED Notes (Signed)
BG 143 @ 0330, didn't flow to chart from meter

## 2022-04-11 NOTE — ED Notes (Signed)
Insulin gtt and LR with D5 turned off. Pt CBG checked. Pt states he is ready to go home, ambulates well.

## 2022-04-11 NOTE — ED Notes (Signed)
Stopped fluids containing dextrose r/t elevated BG, will recheck BG in 1H

## 2022-04-11 NOTE — ED Notes (Addendum)
Pt restarted his insulin pump when I d/c'd his insulin infusion. BG dropped to 85, will hold our insulin infusion and CTM. Attempted to call Dr. Tobie Poet to discuss.

## 2022-04-14 NOTE — Discharge Summary (Signed)
Physician Discharge Summary   Patient: James [name] MRN: [id number] DOB: [birth year]  Admit date:     04/10/2022  Discharge date: 04/11/2022  Discharge Physician: Max Sane   PCP: Kathalene Frames, MD   Recommendations at discharge:    F/up with outpt providers as requested  Discharge Diagnoses: Principal Problem:   DKA (diabetic ketoacidosis) ([place]) Active Problems:   Gastroparesis   Type 1 diabetes mellitus with diabetic retinopathy ([place])   Essential hypertension   Acute kidney injury superimposed on CKD ([place])   Anemia in other chronic diseases classified elsewhere   Uncontrolled type 1 diabetes mellitus with hyperglycemia, with long-term current use of insulin ([place])   Type 1 diabetes mellitus with hyperglycemia ([place])   Hyperlipidemia   Hypotension   Elevated beta-hydroxybutyrate   Dehydration  Hospital Course: James [name] is a 77 male with insulin dependent diabetes type1, 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  Vitals in the ED showed temperature of 97.8, respiration rate of 17, heart rate 87, blood pressure 78/40, SpO2 of 99% on room air.  Serum sodium is 136, potassium 4.6, chloride 97, bicarb 23, BUN of 39, serum creatinine of 2.38, EGFR 34, nonfasting blood glucose 299, anion gap elevated at 16, WBC 5.9, hemoglobin 13.1, platelets of 199.  Lactic acid elevated at 2.1.  Blood cultures x 2 are in process.  ED treatment: Sodium chloride 1 L bolus.  Assessment and Plan: * DKA (diabetic ketoacidosis) ([place]) Very mild Possible continuous glucose monitor malfunction, and not reading blood glucose level appropriately Diabetes educator and nurse consulted for further assistance with continuous glucose  monitor evaluation DKA protocol initiated with insulin gtt., fluid, potassium supplementation - treated the DKA very quickly He requested D/Ced from trom the ED.  Hyperlipidemia Rosuvastatin 20 mg nightly   Uncontrolled type 1 diabetes mellitus with hyperglycemia, with long-term current use of insulin (HCC) A1c in 03/12/2021: 9.2   Anemia in other chronic diseases classified elsewhere At baseline range Resumed home ferrous gluconate 324 mg p.o. every other day  Acute kidney injury superimposed on CKD ([place]) With baseline CKD 3A Status post sodium chloride 1 L bolus per EDP Resolved with hydration  Essential hypertension controlled  Gastroparesis Not an issue while in the hospital He tolerated diet fine.          Disposition: Home Diet recommendation:  Discharge Diet Orders (From admission, onward)     Start     Ordered   04/11/22 0000  Diet - low sodium heart healthy        04/11/22 0948           Carb modified diet DISCHARGE MEDICATION: Allergies as of 04/11/2022   No Known Allergies      Medication List     STOP taking these medications    ferrous sulfate 325 (65 FE) MG tablet   losartan 25 MG tablet Commonly known as: COZAAR       TAKE these medications    acetaminophen 325 MG tablet Commonly known as: TYLENOL Take 2 tablets (650 mg total) by mouth every 6 (six) hours as needed for mild pain (or Fever >/= 101).   cholecalciferol 25 MCG (1000 UNIT) tablet Commonly known as: VITAMIN D3 Take 1,000 Units by mouth daily.   Dexcom G6 Sensor Misc 1 each See  admin instructions.   Dexcom G6 Transmitter Misc 1 each.   ferrous gluconate 324 MG tablet Commonly known as: FERGON Take 324 mg by mouth. Take 1 tablet by mouth 3 (three) times a week   insulin aspart 100 UNIT/ML injection Commonly known as: novoLOG Max daily 30 units  per pump   insulin pump Soln Resume per home dose and endocrine dr instruction. Patient was educated by DM  nurse coordinator and he is well aware   rosuvastatin 20 MG tablet Commonly known as: CRESTOR Take 20 mg by mouth at bedtime.        Follow-up Information     Kathalene Frames, MD. Schedule an appointment as soon as possible for a visit in 2 day(s).   Specialty: Internal Medicine Why: [facility] Discharge F/UP Contact information: [address], [place] [address] [phone]                Discharge Exam: [name] Weights   04/10/22 1845  Weight: 77.1 kg   67 y m lying in the bed comfortably without any acute distress Lungs: CTA b/l Heart: regular rate and rhythm Abd: soft, benign Neuro: alert awake, nonfocal Skin: no rash, lesion  Condition at discharge: good  The results of significant diagnostics from this hospitalization (including imaging, microbiology, ancillary and laboratory) are listed below for reference.   Imaging Studies: DG Chest 2 View  Result Date: 04/10/2022 CLINICAL DATA:  Dizziness, fatigue and muscle tightness for 1 day. Suspected sepsis. EXAM: CHEST - 2 VIEW COMPARISON:  None Available. FINDINGS: The cardiac silhouette, mediastinal and hilar contours are normal. The lungs are clear. No pleural effusion. No pulmonary lesions. No pneumothorax. The bony thorax is intact. IMPRESSION: No acute cardiopulmonary findings. Electronically Signed   By: Marijo Sanes M.D.   On: 04/10/2022 20:00    Microbiology: Results for orders placed or performed during the hospital encounter of 04/10/22  Culture, blood (Routine x 2)     Status: None (Preliminary result)   Collection Time: 04/10/22  6:56 PM   Specimen: BLOOD  Result Value Ref Range Status   Specimen Description BLOOD BLOOD RIGHT ARM  Final   Special Requests   Final    BOTTLES DRAWN AEROBIC AND ANAEROBIC Blood Culture adequate volume   Culture   Final    NO GROWTH 4 DAYS Performed at [facility], [address] [place], [place] [address]    Report Status  PENDING  Incomplete    Labs: CBC: Recent Labs  Lab 04/10/22 1850 04/11/22 0441  WBC 5.9 6.2  NEUTROABS 4.7  --   HGB 13.1 11.9*  HCT 40.9 38.1*  MCV 82.1 82.3  PLT 199 0000000   Basic Metabolic Panel: Recent Labs  Lab 04/10/22 1850 04/10/22 2100 04/11/22 0238 04/11/22 0441 04/11/22 0753  NA 136 134* 137 137 140  K 4.6 4.0 4.2 3.9 4.3  CL 97* 100 105 104 107  CO2 23 25 26 26 28   GLUCOSE 299* 320* 125* 206* 137*  BUN 39* 40* 33* 32* 29*  CREATININE 2.38* 2.16* 1.58* 1.39* 1.40*  CALCIUM 9.3 8.5* 8.5* 8.5* 8.8*   Liver Function Tests: Recent Labs  Lab 04/10/22 1850  AST 16  ALT 24  ALKPHOS 108  BILITOT 0.7  PROT 7.7  ALBUMIN 3.7   CBG: Recent Labs  Lab 04/11/22 0625 04/11/22 0732 04/11/22 0839 04/11/22 0953 04/11/22 1101  GLUCAP 186* 180* 129* 149* 193*    Discharge time spent: greater than  30 minutes.  Signed: Max Sane, MD [facility] 04/14/2022

## 2022-04-15 LAB — CULTURE, BLOOD (ROUTINE X 2)
Culture: NO GROWTH
Special Requests: ADEQUATE

## 2022-04-24 DIAGNOSIS — Z419 Encounter for procedure for purposes other than remedying health state, unspecified: Secondary | ICD-10-CM | POA: Diagnosis not present

## 2022-05-02 IMAGING — MR MR FOOT*L* W/O CM
5 series · 40 of 40 positions shown · non-contrast
Comparison: Foot radiograph same day

CLINICAL DATA: Osteomyelitis, foot

EXAM:
MRI OF THE LEFT FOOT WITHOUT CONTRAST
TECHNIQUE: Multiplanar, multisequence MR imaging of the left foot was
performed. No intravenous contrast was administered.

[Series 3: T1 · coronal · left · 3.0mm · 0.38mm/px · 11 of 50 slices shown (1 of 2)]
[im 1/50]
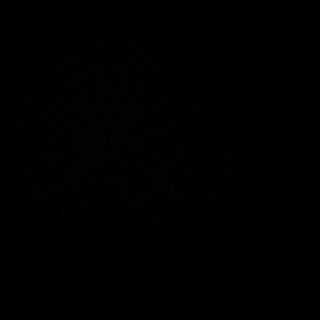
[im 5/50]
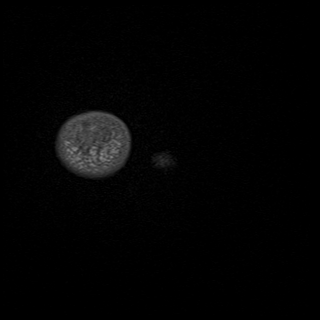
[im 10/50]
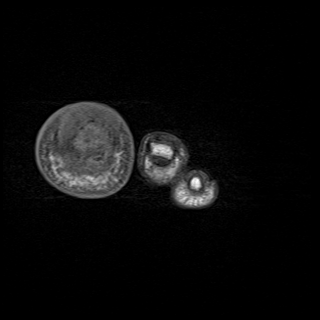
[im 15/50]
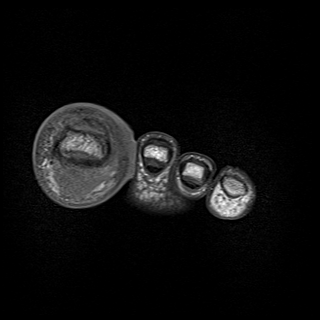
[im 20/50]
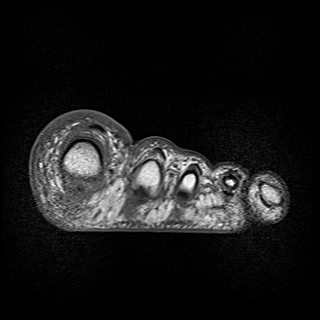
[im 25/50]
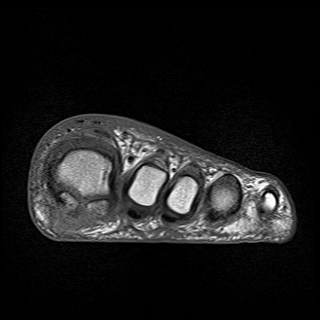
[im 30/50]
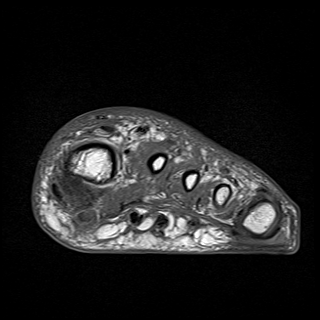
[im 35/50]
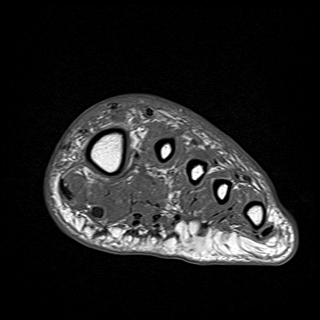
[im 40/50]
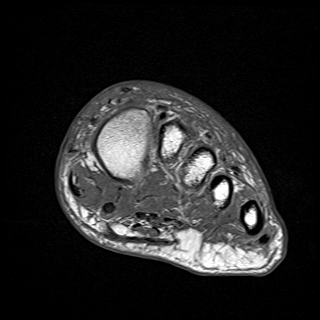
[im 45/50]
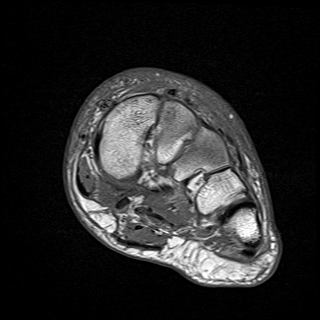
[im 50/50]
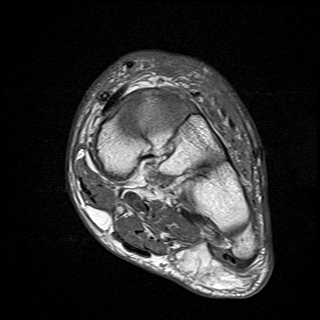

[Series 5: T2 · coronal · left · 3.0mm · 0.38mm/px · 11 of 50 slices shown (1 of 2)]
[im 1/50]
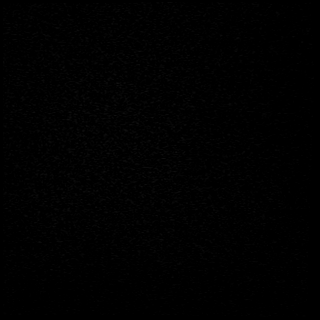
[im 5/50]
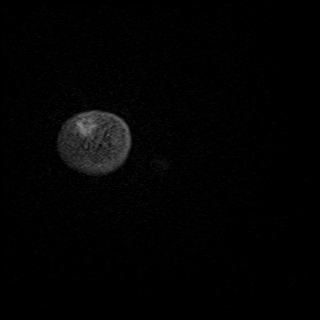
[im 10/50]
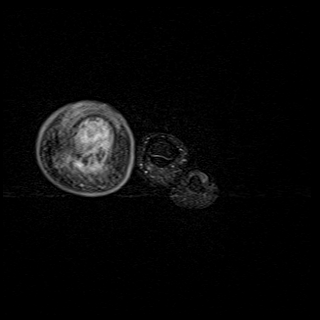
[im 15/50]
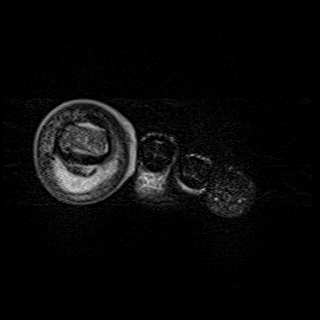
[im 20/50]
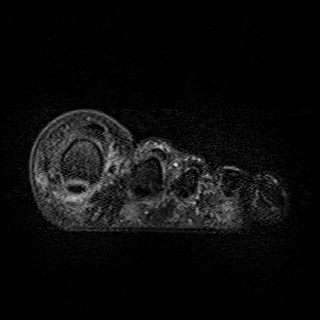
[im 25/50]
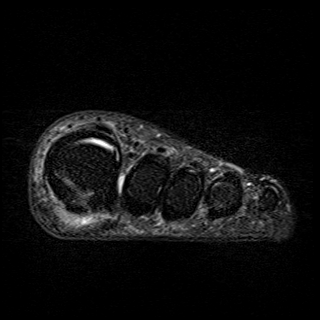
[im 30/50]
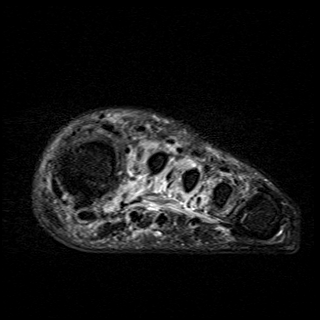
[im 35/50]
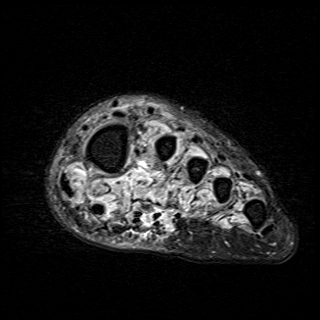
[im 40/50]
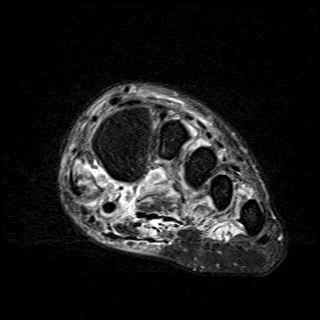
[im 45/50]
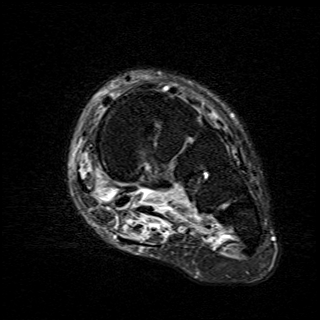
[im 50/50]
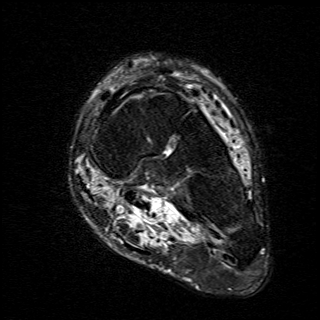

[Series 6: T1 · axial · left · 3.0mm · 0.70mm/px · z∈[-105,-32]mm · 5 of 20 slices shown (2 of 2)]
[im 1/20]
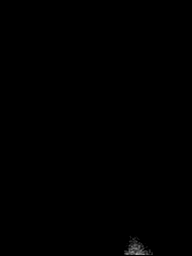
[im 5/20]
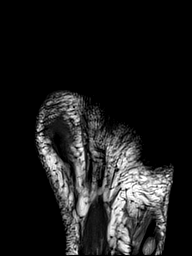
[im 10/20]
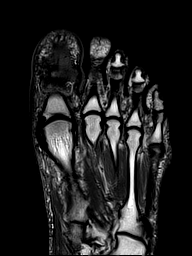
[im 15/20]
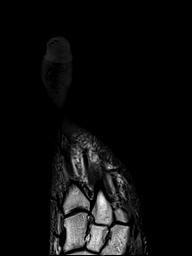
[im 20/20]
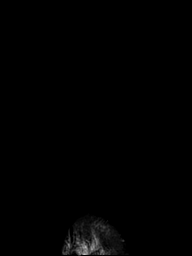

[Series 8: T2 · axial · left · 3.0mm · 0.70mm/px · z∈[-105,-32]mm · 5 of 20 slices shown (2 of 2)]
[im 1/20]
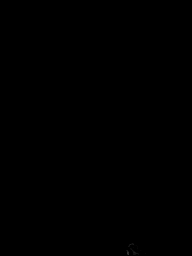
[im 5/20]
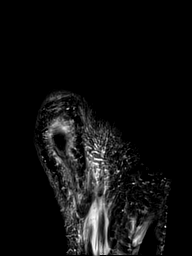
[im 10/20]
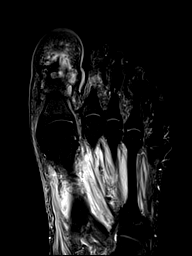
[im 15/20]
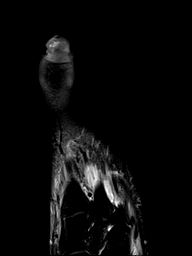
[im 20/20]
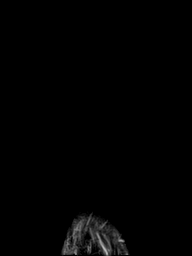

[Series 9: STIR · sagittal · left · 3.0mm · 0.62mm/px · 8 of 33 slices shown]
[im 1/33]
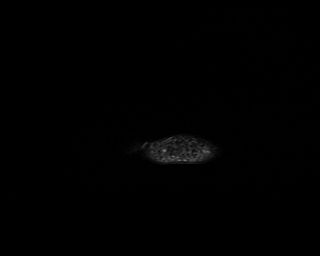
[im 5/33]
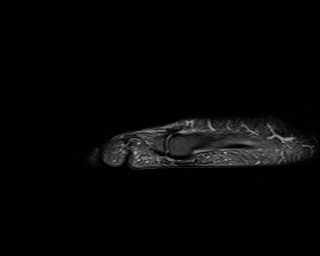
[im 10/33]
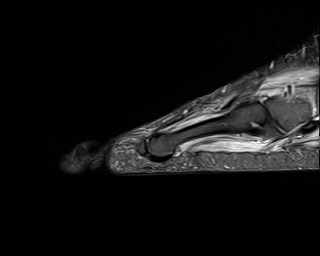
[im 14/33]
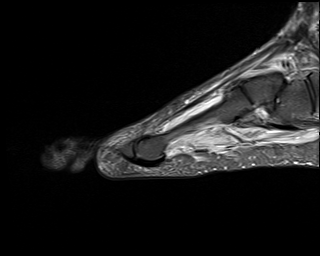
[im 19/33]
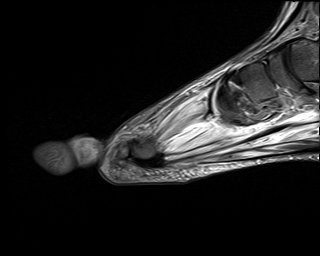
[im 23/33]
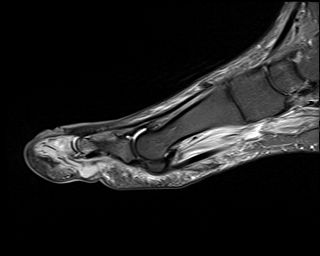
[im 28/33]
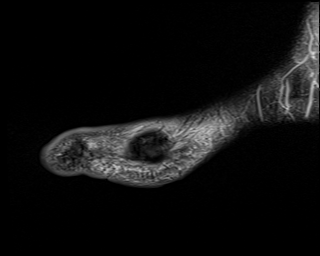
[im 33/33]
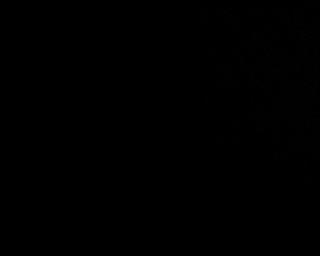

[40 of 40 positions shown; findings below may reference images not displayed]

FINDINGS: Bones/Joint/Cartilage

There is intense bony edema and confluent low T1 signal within the
great toe distal phalanx. There is minimal edema signal within the
great toe proximal phalanx with preserved T1 signal. Small great toe
interphalangeal joint effusion. No other abnormal marrow signal.

Ligaments

Intact Lisfranc ligament.  Intact MTP collateral ligaments.

Muscles and Tendons

Diffuse intramuscular edema in the foot as is commonly seen in
diabetics.

Soft tissues

Diffuse soft tissue swelling of the foot. There is a collection of
fluid along the plantar aspect of the great toe measuring 2.2 x
x 1.7 cm (axial T2 image 14, sagittal STIR image 11. This abuts the
flexor tendon. Great toe soft tissue ulcer with adjacent gas near
the tip.
IMPRESSION: Osteomyelitis of the great toe distal phalanx with adjacent soft
tissue ulcer and soft tissue gas. Reactive marrow edema versus early
osteomyelitis of the great toe proximal phalanx. Small great toe IP
joint effusion. Fluid collection along the plantar aspect of the
great toe at the level of the interphalangeal joint measuring 2.2 x
1.0 x 1.7 cm, possibly an abscess.

## 2022-05-02 IMAGING — DX DG FOOT 2V*L*
2 series · 2 of 2 positions shown · non-contrast
Comparison: None.

CLINICAL DATA: Great toe injury.

EXAM:
LEFT FOOT - 2 VIEW

[foot ap]
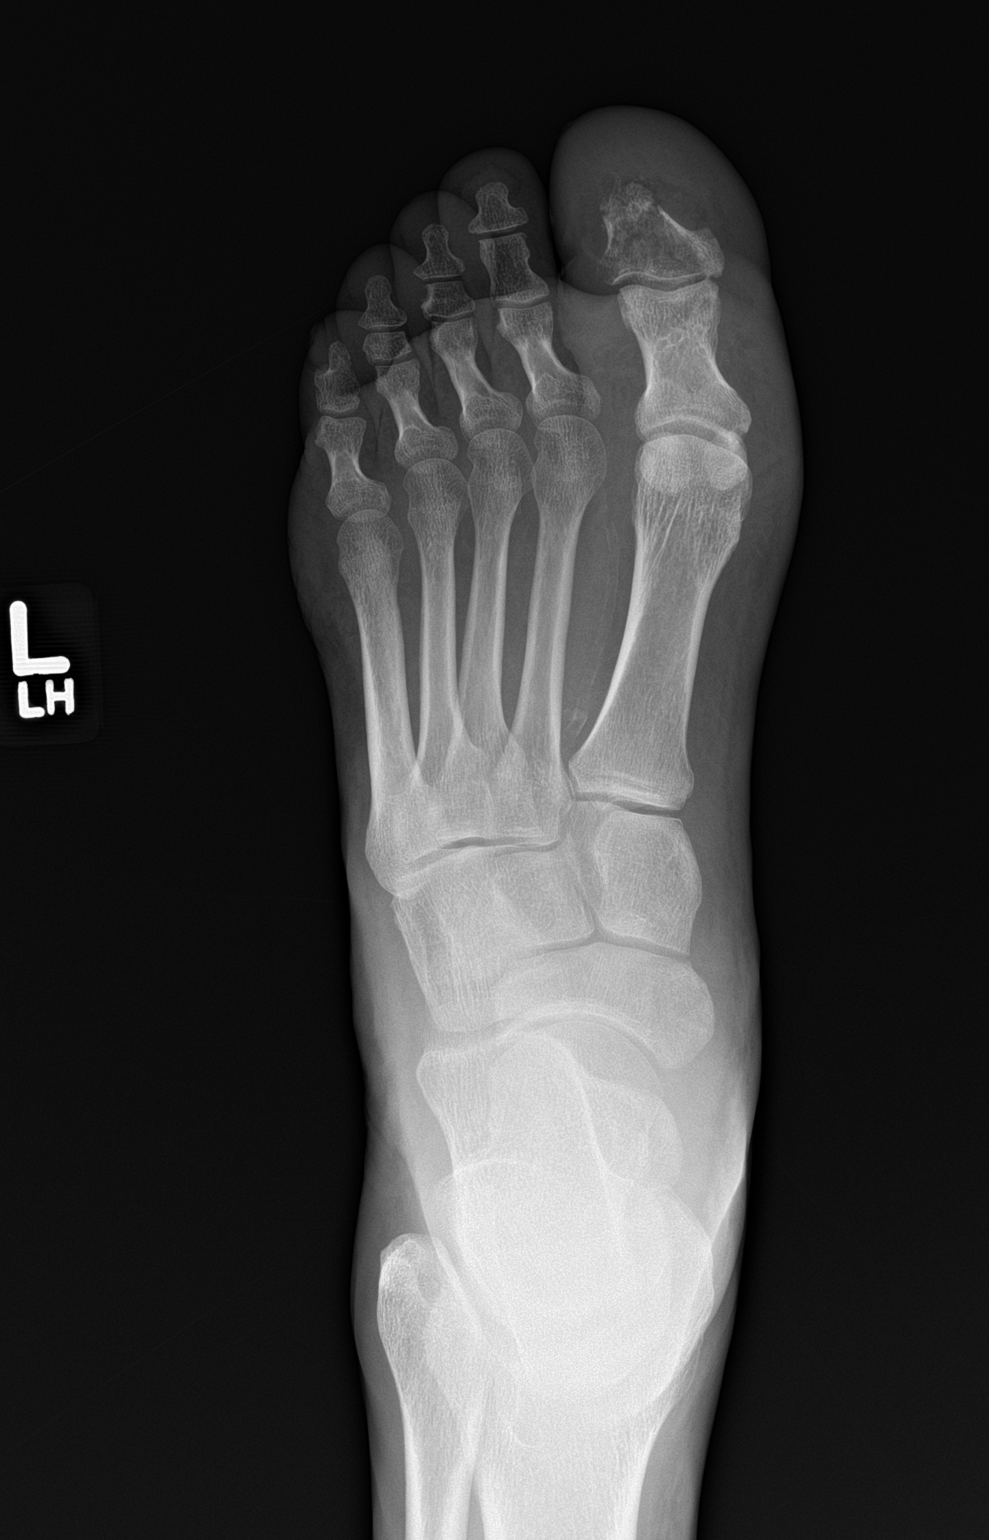

[foot lat]
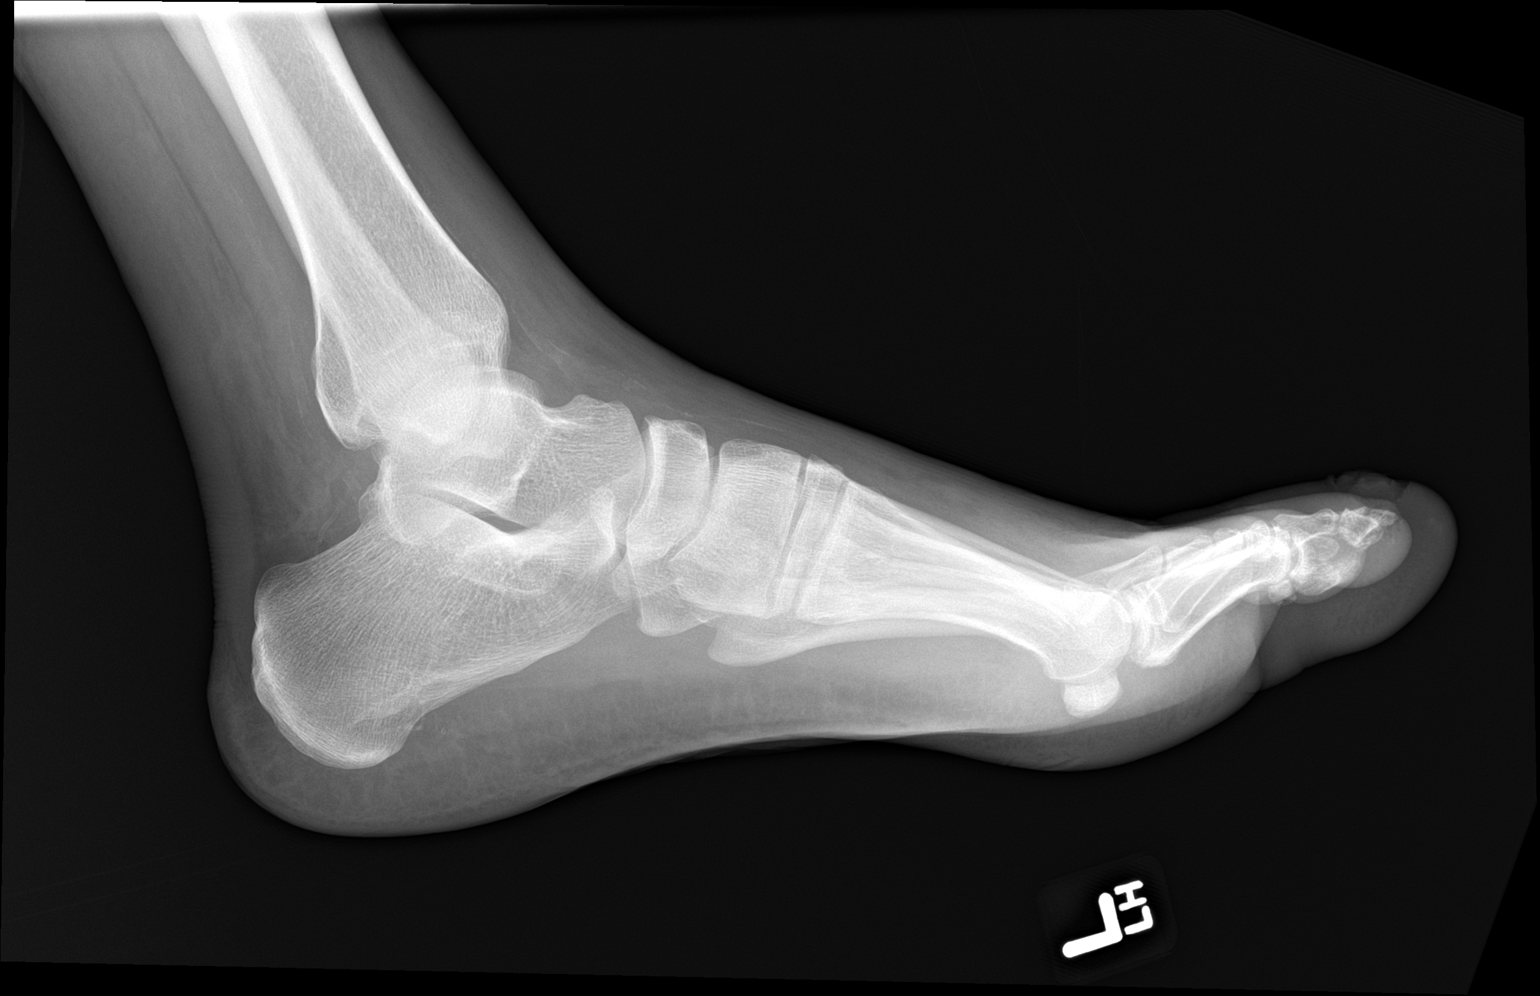

[2 of 2 positions shown; findings below may reference images not displayed]

FINDINGS: Destruction of the tuft of the great toe distal phalanx evident with
a moth-eaten appearance in the remaining portion of the phalanx.
There appears to be some associated soft tissue gas. Otherwise bony
anatomy unremarkable.
IMPRESSION: Destruction of the tuft of the great toe distal phalanx compatible
with osteomyelitis. There is a moth-eaten appearance of the
remaining portion of the distal phalanx compatible also suggestive
of osteomyelitis.

## 2022-05-03 IMAGING — DX DG FOOT 2V*L*
2 series · 2 of 2 positions shown · non-contrast
Comparison: 03/12/2021

CLINICAL DATA: Status post left toe amputation.

EXAM:
LEFT FOOT - 2 VIEW

[foot ap]
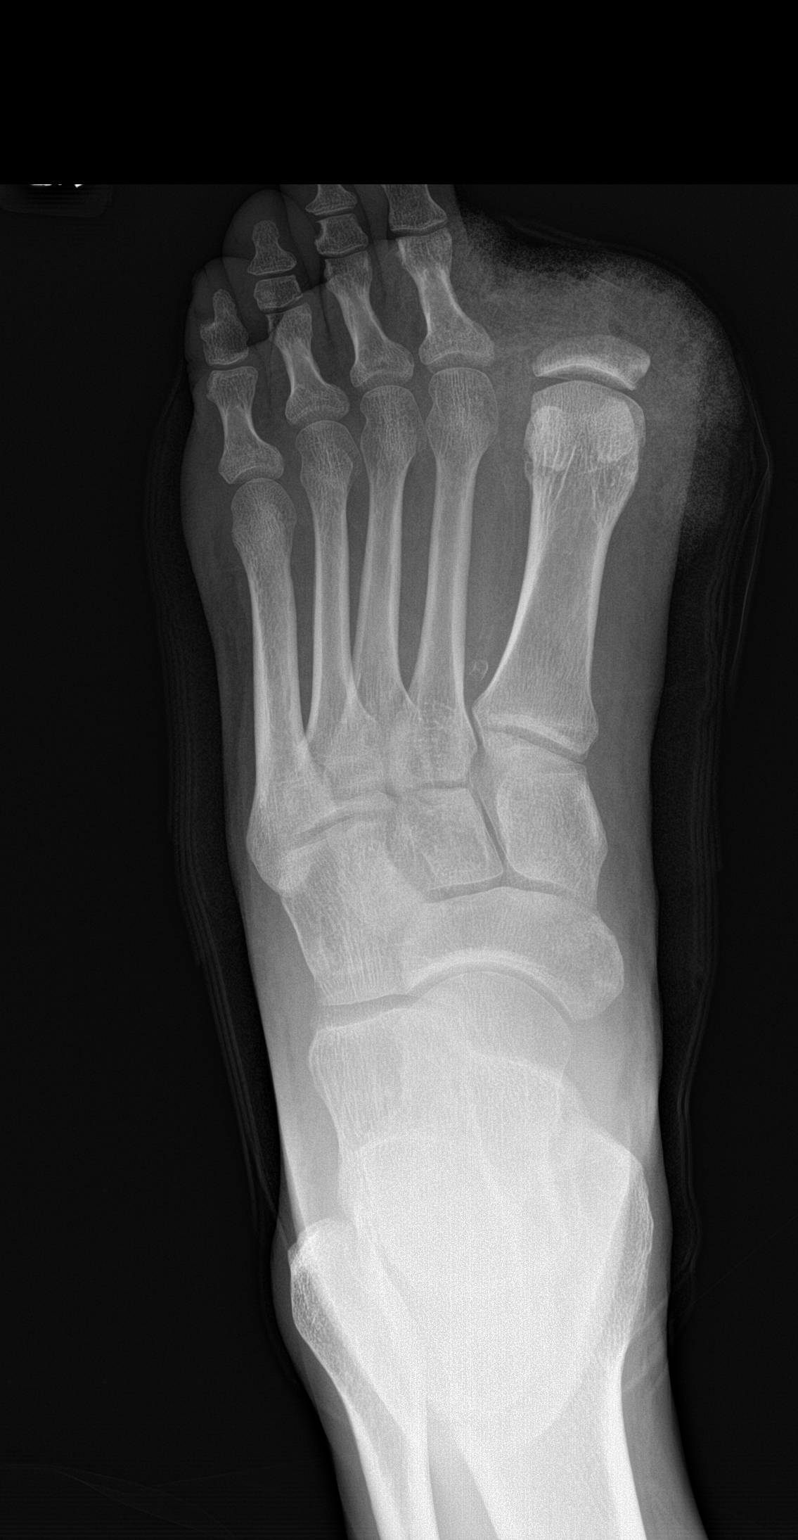

[foot lat]
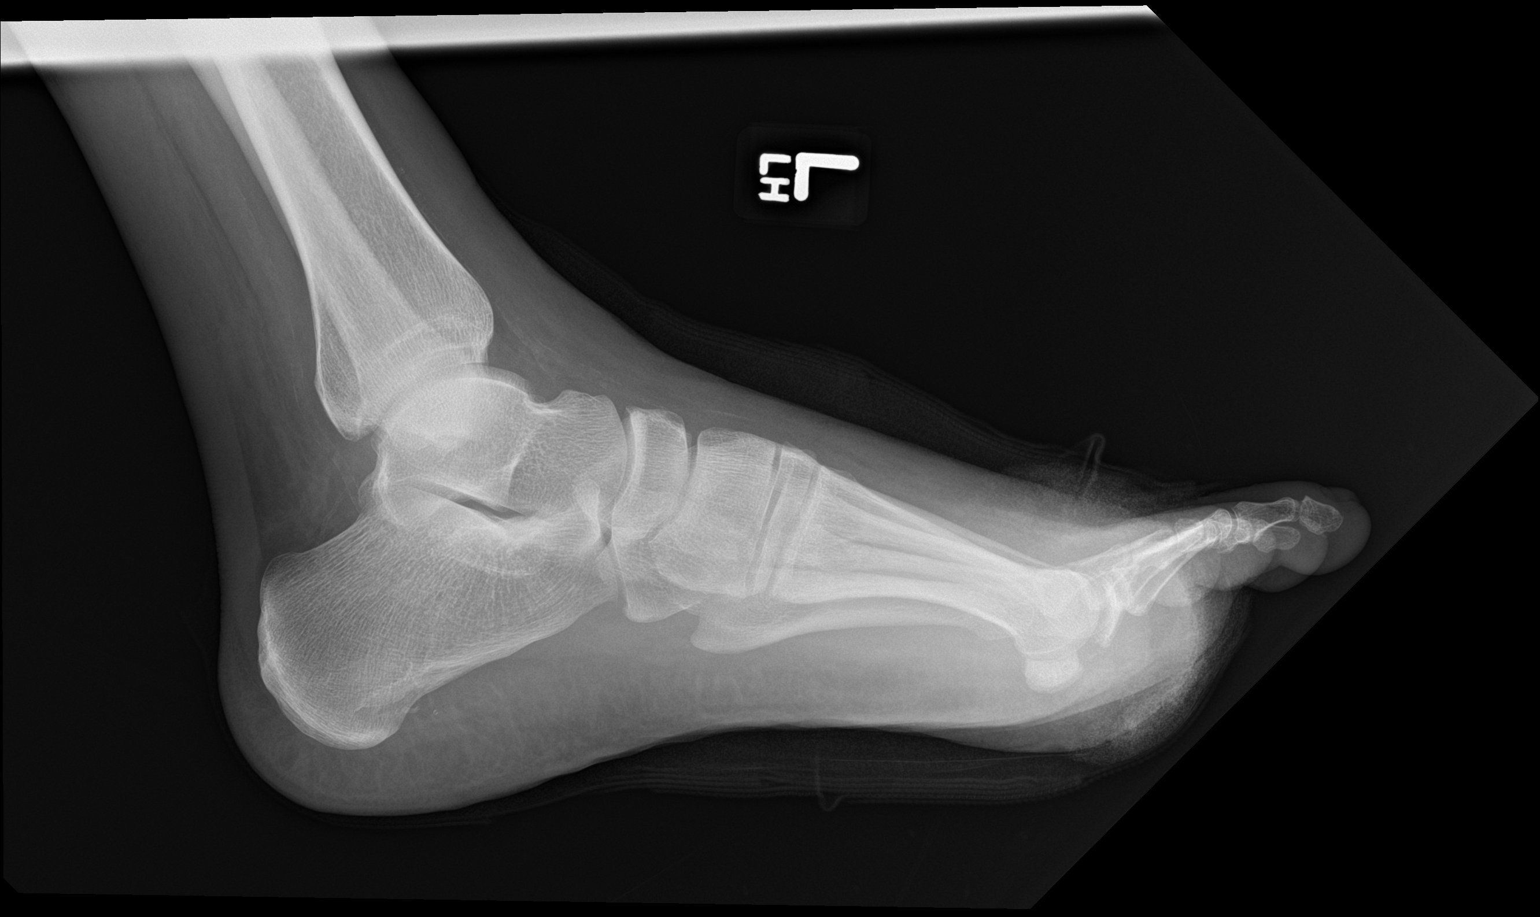

[2 of 2 positions shown; findings below may reference images not displayed]

FINDINGS: Postoperative change from interval first ray amputation at the base
of the first proximal phalanx. No complications identified.
Overlying soft tissue swelling and bandage material identified.
Small foci of gas is noted within the amputation site.
IMPRESSION: Postoperative change from interval first ray amputation at the base
of the first proximal phalanx.

## 2022-05-09 ENCOUNTER — Ambulatory Visit (INDEPENDENT_AMBULATORY_CARE_PROVIDER_SITE_OTHER): Payer: 59 | Admitting: Podiatry

## 2022-05-09 DIAGNOSIS — M21961 Unspecified acquired deformity of right lower leg: Secondary | ICD-10-CM

## 2022-05-09 DIAGNOSIS — M216X1 Other acquired deformities of right foot: Secondary | ICD-10-CM

## 2022-05-09 DIAGNOSIS — M216X2 Other acquired deformities of left foot: Secondary | ICD-10-CM

## 2022-05-09 DIAGNOSIS — Q666 Other congenital valgus deformities of feet: Secondary | ICD-10-CM

## 2022-05-09 NOTE — Progress Notes (Signed)
  Subjective:  Patient ID: James [name], male    DOB: 03-Jul-1980,  MRN: [id number]  Chief Complaint  Patient presents with   Callouses    42 y.o. male presents with the above complaint.  Patient presents with complaint of flatfoot deformity with history of left great toe amputation done by me.  Patient states that he is doing okay.  He is a diabetic with last A1c of 10.4%.  He is here to get a new pair of shoes he denies any other acute complaints.  Would like to discuss treatment options for this.  Pain scale 7 out of 10 dull achy in nature.   Review of Systems: Negative except as noted in the HPI. Denies N/V/F/Ch.  Past Medical History:  Diagnosis Date   Diabetes mellitus without complication (HCC)     Current Outpatient Medications:    acetaminophen (TYLENOL) 325 MG tablet, Take 2 tablets (650 mg total) by mouth every 6 (six) hours as needed for mild pain (or Fever >/= 101)., Disp: , Rfl:    cholecalciferol (VITAMIN D3) 25 MCG (1000 UNIT) tablet, Take 1,000 Units by mouth daily., Disp: , Rfl:    Continuous Blood Gluc Sensor (DEXCOM G6 SENSOR) MISC, 1 each See admin instructions., Disp: , Rfl:    Continuous Blood Gluc Transmit (DEXCOM G6 TRANSMITTER) MISC, 1 each., Disp: , Rfl:    ferrous gluconate (FERGON) 324 MG tablet, Take 324 mg by mouth. Take 1 tablet by mouth 3 (three) times a week, Disp: , Rfl:    insulin aspart (NOVOLOG) 100 UNIT/ML injection, Max daily 30 units  per pump, Disp: 30 mL, Rfl: 3   Insulin Human (INSULIN PUMP) SOLN, Resume per home dose and endocrine dr instruction. Patient was educated by DM nurse coordinator and he is well aware, Disp: , Rfl:    rosuvastatin (CRESTOR) 20 MG tablet, Take 20 mg by mouth at bedtime., Disp: , Rfl:   Social History   Tobacco Use  Smoking Status Never  Smokeless Tobacco Never    No Known Allergies Objective:  There were no vitals filed for this visit. There is no height or weight on file to calculate BMI. Constitutional  Well developed. Well nourished.  Vascular Dorsalis pedis pulses palpable bilaterally. Posterior tibial pulses palpable bilaterally. Capillary refill normal to all digits.  No cyanosis or clubbing noted. Pedal hair growth normal.  Neurologic Normal speech. Oriented to person, place, and time. Epicritic sensation to light touch grossly present bilaterally.  Dermatologic Nails well groomed and normal in appearance. No open wounds. No skin lesions.  Orthopedic: Partial left great toe amputation noted well-healed and reepithelialized.  Gait examination shows pes planovalgus deformity with calcaneovalgus to many toe signs unable to recreate the arch with dorsiflexion of the hallux   Radiographs: None Assessment:   1. Pes planovalgus   2. Deformity of both feet    Plan:  Patient was evaluated and treated and all questions answered.  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  This will help address the foot deformity as well as evenly distribute the pressure to prevent reoccurrence of ulceration.  Patient agrees with plan like to proceed with orthotics  No follow-ups on file.

## 2022-05-24 DIAGNOSIS — Z419 Encounter for procedure for purposes other than remedying health state, unspecified: Secondary | ICD-10-CM | POA: Diagnosis not present

## 2022-06-09 ENCOUNTER — Emergency Department
Admission: EM | Admit: 2022-06-09 | Discharge: 2022-06-09 | Disposition: A | Discharge status: Home / Self Care | Payer: 59 | Attending: Emergency Medicine | Admitting: Emergency Medicine

## 2022-06-09 ENCOUNTER — Other Ambulatory Visit: Payer: Self-pay

## 2022-06-09 DIAGNOSIS — L02211 Cutaneous abscess of abdominal wall: Secondary | ICD-10-CM | POA: Diagnosis present

## 2022-06-09 DIAGNOSIS — E109 Type 1 diabetes mellitus without complications: Secondary | ICD-10-CM | POA: Insufficient documentation

## 2022-06-09 DIAGNOSIS — L0291 Cutaneous abscess, unspecified: Secondary | ICD-10-CM

## 2022-06-09 MED ORDER — CEPHALEXIN 500 MG PO CAPS: 500.0000 mg | ORAL_CAPSULE | Freq: Four times a day (QID) | ORAL | 0 refills | Status: AC

## 2022-06-09 MED ORDER — LIDOCAINE-EPINEPHRINE (PF) 2 %-1:200000 IJ SOLN
10.0000 mL | Freq: Once | INTRAMUSCULAR | Status: AC
  Administered 2022-06-09: 10 mL
  Filled 2022-06-09: qty 20

## 2022-06-09 MED ORDER — CEPHALEXIN 500 MG PO CAPS
500.0000 mg | ORAL_CAPSULE | Freq: Once | ORAL | Status: AC
  Administered 2022-06-09: 500 mg via ORAL
  Filled 2022-06-09: qty 1

## 2022-06-09 NOTE — ED Triage Notes (Signed)
Pt presents to ED with c/o of abscess RLQ area due to dexcom insertion site. NAD noted.

## 2022-06-09 NOTE — ED Provider Notes (Signed)
[facility] Emergency Department Provider Note     Event Date/Time   First MD Initiated Contact with Patient 06/09/22 1229     (approximate)   History   Abscess   HPI  James [name] is a 42 y.o. male with a past medical history of type 1 diabetes who presents to the emergency department with a complaint of an abscess on his abdomen x 1 week.  Patient reports he noticed a lump about a week ago and it has now progressed in size.  Location: right lower quadrant.  Patient reports painful when touched and yellow fluid leaks out.  Patient has concerns of infection.  No trial of treatment to date.  No other complaints at this time.  He denies fever, chills, and abdominal pain.     Physical Exam   Triage Vital Signs: ED Triage Vitals  Enc Vitals Group     BP 06/09/22 1037 (!) 134/90     Pulse Rate 06/09/22 1037 95     Resp 06/09/22 1037 18     Temp 06/09/22 1037 98 F (36.7 C)     Temp Source 06/09/22 1037 Oral     SpO2 06/09/22 1037 100 %     Weight 06/09/22 1247 169 lb 15.6 oz (77.1 kg)     Height 06/09/22 1247 6\' 2"  (1.88 m)     Head Circumference --      Peak Flow --      Pain Score 06/09/22 1036 4     Pain Loc --      Pain Edu? --      Excl. in GC? --     Most recent vital signs: Vitals:   06/09/22 1037 06/09/22 1442  BP: (!) 134/90 (!) 130/90  Pulse: 95 90  Resp: 18 16  Temp: 98 F (36.7 C) 97.9 F (36.6 C)  SpO2: 100% 100%    General Awake, no distress.  HEENT NCAT. PERRL. EOMI. No rhinorrhea.  CV:  Good peripheral perfusion. RRR RESP:  Normal effort. LCTAB ABD:  Inspection upon RLQ reveals a approximate 4 cm x 3 cm tender, fluctuant mass on an erythematous indurated base. Boggy like on palpation.  yellow purulent drainage noted with bandage removal.    ED Results / Procedures / Treatments   Labs (all labs ordered are listed, but only abnormal results are displayed) Labs Reviewed - No data to  display   PROCEDURES:  Critical Care performed: No  ..Incision and Drainage  Date/Time: 06/09/2022 9:39 PM  Performed by: Kern Reap A, PA-C Authorized by: Kern Reap A, PA-C   Consent:    Consent obtained:  Verbal   Consent given by:  Patient   Risks, benefits, and alternatives were discussed: yes     Risks discussed:  Bleeding, incomplete drainage, infection and pain Location:    Type:  Abscess   Location:  Trunk   Trunk location:  Abdomen Pre-procedure details:    Skin preparation:  Povidone-iodine Sedation:    Sedation type:  None Anesthesia:    Anesthesia method:  Local infiltration   Local anesthetic:  Lidocaine 2% WITH epi Procedure type:    Complexity:  Simple Procedure details:    Incision types:  Single straight   Incision depth:  Subcutaneous   Wound management:  Probed and deloculated, irrigated with saline and extensive cleaning   Drainage:  Purulent and bloody   Drainage amount:  Moderate   Wound treatment:  Wound left open   Packing  materials:  1/4 in iodoform gauze Post-procedure details:    Procedure completion:  Tolerated    MEDICATIONS ORDERED IN ED: Medications  cephALEXin (KEFLEX) capsule 500 mg (500 mg Oral Given 06/09/22 1443)  lidocaine-EPINEPHrine (XYLOCAINE W/EPI) 2 %-1:200000 (PF) injection 10 mL (10 mLs Infiltration Given by Other 06/09/22 1443)     IMPRESSION / MDM / ASSESSMENT AND PLAN / ED COURSE  I reviewed the triage vital signs and the nursing notes.                              Differential diagnosis includes, but is not limited to, skin abscess, cellulitis,   Patient's presentation is most consistent with acute illness / injury with system symptoms.  42 year old male presents to the ED for evaluation and treatment of an abscess.  See HPI.  Abscess needed incision and drainage.  Due to the history of diabetes and size of abscess, it will be left open and packed with iodoform.  Patient was educated on wound care at  home.  Discussed gently cleaning the area with soap and water only.  A nonadherent dressing is placed over wound.  Patient is instructed to follow-up in 2 days to remove packing.  He was given 1 dose of Keflex in the ED and will be prescribed Keflex to pick up at pharmacy at discharge. Patient is given ED precautions to return to the ED for any worsening or new symptoms.  Patient verbalizes understanding and of assessment and plan.     FINAL CLINICAL IMPRESSION(S) / ED DIAGNOSES   Final diagnoses:  Abscess     Rx / DC Orders   ED Discharge Orders          Ordered    cephALEXin (KEFLEX) 500 MG capsule  4 times daily        06/09/22 1612             Note:  This document was prepared using Dragon voice recognition software and may include unintentional dictation errors.    Romeo Apple, Helio Lack A, PA-C 06/10/22 2042    Sharman Cheek, MD 06/12/22 [phone]

## 2022-06-10 ENCOUNTER — Encounter: Payer: Self-pay | Admitting: Emergency Medicine

## 2022-06-22 ENCOUNTER — Ambulatory Visit (INDEPENDENT_AMBULATORY_CARE_PROVIDER_SITE_OTHER): Payer: 59 | Admitting: Internal Medicine

## 2022-06-22 ENCOUNTER — Encounter: Payer: Self-pay | Admitting: Internal Medicine

## 2022-06-22 VITALS — BP 122/80 | HR 76 | Ht 74.0 in | Wt 172.0 lb

## 2022-06-22 DIAGNOSIS — N1831 Chronic kidney disease, stage 3a: Secondary | ICD-10-CM | POA: Diagnosis not present

## 2022-06-22 DIAGNOSIS — E1022 Type 1 diabetes mellitus with diabetic chronic kidney disease: Secondary | ICD-10-CM

## 2022-06-22 DIAGNOSIS — E1065 Type 1 diabetes mellitus with hyperglycemia: Secondary | ICD-10-CM | POA: Diagnosis not present

## 2022-06-22 DIAGNOSIS — E103593 Type 1 diabetes mellitus with proliferative diabetic retinopathy without macular edema, bilateral: Secondary | ICD-10-CM

## 2022-06-22 DIAGNOSIS — E785 Hyperlipidemia, unspecified: Secondary | ICD-10-CM

## 2022-06-22 MED ORDER — LOSARTAN POTASSIUM 25 MG PO TABS: 25.0000 mg | ORAL_TABLET | Freq: Every day | ORAL | 3 refills | Status: DC

## 2022-06-22 MED ORDER — DEXCOM G7 SENSOR MISC: 1.0000 | 3 refills | Status: DC

## 2022-06-22 NOTE — Progress Notes (Signed)
Name: James [name]  MRN/ DOB: [id number], 06-09-1980   Age/ Sex: 42 y.o., male    PCP: Emilio Aspen, MD   Reason for Endocrinology Evaluation: Type 1 Diabetes Mellitus     Date of Initial Endocrinology Visit: 12/21/2021    PATIENT IDENTIFIER: [name] is a 42 y.o. male with a past medical history of T1DM. The patient presented for initial endocrinology clinic visit on 12/21/2021 for consultative assistance with his diabetes management.    HPI: [name] was    Diagnosed with DM at age 7 Prior Medications tried/Intolerance: has been on the pump for years  Hemoglobin A1c has ranged from 8.3% in 2022, peaking at 9.2% in 2023.     SUBJECTIVE:   During the last visit (12/21/2021): 8.1%  Today (06/22/22): James [name] checks his  blood sugars multiple  times daily. The patient has  had hypoglycemic episodes since the last clinic visit. The patient is  symptomatic with these episodes.  He had a DKA 03/2022  Denies nausea or vomiting Denies constipation but has diarrhea that he attributes to antibiotics    This patient with type 1 diabetes is treated with Tandem  (insulin pump). During the visit the pump basal and bolus doses were reviewed including carb/insulin rations and supplemental doses. The clinical list was updated. The glucose meter download was reviewed in detail to determine if the current pump settings are providing the best glycemic control without excessive hypoglycemia.    Pump and meter download:    Pump   Tandem   Settings   Insulin type   Novolog    Basal rate       0000 0.675 u/h    0600 0.875   1700 0.790   2130 0.750          I:C ratio       0000 1:15                  Sensitivity       0000  50      Goal       0000  110             Type & Model of Pump: Tandem  Insulin Type: Currently using Novolog .  Body mass index is 22.08 kg/m.  PUMP STATISTICS: Average BG: 244  Average Daily Carbs (g): 0 Average  Total Daily Insulin: 44.42  Average Daily Basal: 29 (55 %) Average Daily Bolus: 24 (45 %)    HOME DIABETES REGIMEN: Novolog  Losartan 25 mg daily    Statin: yes ACE-I/ARB: no   CONTINUOUS GLUCOSE MONITORING RECORD INTERPRETATION    Dates of Recording: 5/17-5/30/2024  Sensor description:dexcom G6  Results statistics:   CGM use % of time 81  Average and SD 244/94  Time in range     56   %  % Time Above 180 40  % Time Below target 4   Glycemic patterns summary: BG's trend down overnight, and increased throughout the day  Hyperglycemic episodes postprandial  Hypoglycemic episodes occurred rare after bolus  Overnight periods: Trends down to optimal   DIABETIC COMPLICATIONS: Microvascular complications:  S/P left great toe amputation, retinopathy B/L ( he is on injection)  Denies: neuropathy Last eye exam: Completed 11/28/ 2023  Macrovascular complications:   Denies: CAD, PVD, CVA   PAST HISTORY: Past Medical History:  Past Medical History:  Diagnosis Date   Diabetes mellitus without complication (HCC)    Past Surgical History:  Past  Surgical History:  Procedure Laterality Date   AMPUTATION TOE Left 03/13/2021   Procedure: AMPUTATION BIG TOE;  Surgeon: Candelaria Stagers, DPM;  Location: [facility] ORS;  Service: Podiatry;  Laterality: Left;   COCHLEAR IMPLANT  05/27/2020   [facility]    Social History:  reports that he has never smoked. He has never used smokeless tobacco. He reports that he does not drink alcohol and does not use drugs. Family History:  Family History  Problem Relation Age of Onset   Diabetes Father      HOME MEDICATIONS: Allergies as of 06/22/2022   No Known Allergies      Medication List        Accurate as of Jun 22, 2022 11:14 AM. If you have any questions, ask your nurse or doctor.          acetaminophen 325 MG tablet Commonly known as: TYLENOL Take 2 tablets (650 mg total) by mouth every 6 (six) hours as needed for mild pain (or  Fever >/= 101).   cholecalciferol 25 MCG (1000 UNIT) tablet Commonly known as: VITAMIN D3 Take 1,000 Units by mouth daily.   Dexcom G6 Sensor Misc 1 each See admin instructions.   Dexcom G6 Transmitter Misc 1 each.   ferrous gluconate 324 MG tablet Commonly known as: FERGON Take 324 mg by mouth. Take 1 tablet by mouth 3 (three) times a week   insulin aspart 100 UNIT/ML injection Commonly known as: novoLOG Max daily 30 units  per pump   insulin pump Soln Resume per home dose and endocrine dr instruction. Patient was educated by DM nurse coordinator and he is well aware   losartan 25 MG tablet Commonly known as: COZAAR Take 25 mg by mouth daily.   rosuvastatin 20 MG tablet Commonly known as: CRESTOR Take 20 mg by mouth at bedtime.         ALLERGIES: No Known Allergies   REVIEW OF SYSTEMS: A comprehensive ROS was conducted with the patient and is negative except as per HPI     OBJECTIVE:   VITAL SIGNS: BP 122/80 (BP Location: Left Arm, Patient Position: Sitting, Cuff Size: Large)   Pulse 76   Ht 6\' 2"  (1.88 m)   Wt 172 lb (78 kg)   SpO2 99%   BMI 22.08 kg/m    PHYSICAL EXAM:  General: Pt appears well and is in NAD  Neck: General: Supple without adenopathy or carotid bruits. Thyroid: Thyroid size normal.  No goiter or nodules appreciated.   Lungs: Clear with good BS bilat with no rales, rhonchi, or wheezes  Heart: RRR   Abdomen:  soft, nontender  Extremities: No pretibial edema  Neuro: MS is good with appropriate affect, pt is alert and Ox3    DM foot exam: 12/21/2021   The skin of the feet is without sores or ulcerations, S/P left great toe amputation The pedal pulses are 2+ on right and 2+ on left. The sensation is intact to a screening 5.07, 10 gram monofilament bilaterally  DATA REVIEWED:  Lab Results  Component Value Date   HGBA1C 10.4 (H) 04/10/2022   HGBA1C 8.1 (A) 12/21/2021   HGBA1C 9.2 (H) 03/12/2021    Latest Reference Range &  Units 04/11/22 07:53  Sodium 135 - 145 mmol/L 140  Potassium 3.5 - 5.1 mmol/L 4.3  Chloride 98 - 111 mmol/L 107  CO2 22 - 32 mmol/L 28  Glucose 70 - 99 mg/dL 161 (H)  BUN 6 - 20 mg/dL 29 (H)  Creatinine 0.61 - 1.24 mg/dL 4.09 (H)  Calcium 8.9 - 10.3 mg/dL 8.8 (L)  Anion gap 5 - 15  5  GFR, Estimated >60 mL/min >60       ASSESSMENT / PLAN / RECOMMENDATIONS:   1) Type 1 Diabetes Mellitus, Poorly controlled, With CKD III complications - Most recent A1c of 10.4%. Goal A1c < 7.0 %.    -His A1c has trended up from 8.1% to 10.4%, the patient is unable to tell me if anything changed with his pump/CGM use or lifestyle -In reviewing his CGM/pump download, the patient does not bolus for any carbohydrates at all, he depends on IQ technology for correction as well as basal insulin -I have given the patient a standing dose of carbohydrates to enter with each meal, #4, and I have changed his insulin to carb ratio to 1:1  -I have also increase his basal rate in the evening, no change to basal rate during the day as I am hoping once he starts entering this 4 g, it will correct postprandial hyperglycemia -A prescription for Dexcom G7 was sent to his pharmacy, patient advised to contact tandem to upgrade the software to the new Dexcom  Pump   Tandem   Settings   Insulin type   Novolog    Basal rate       0000 0.700 u/h    0600 0.875   1700 0.790   2130 0.800          I:C ratio       0000 1:1 Enter #4 g TIDQAC                  Sensitivity       0000  50      Goal       0000  110          MEDICATIONS: NovoLog  EDUCATION / INSTRUCTIONS: BG monitoring instructions: Patient is instructed to check his blood sugars 3 times a day, before meals . Call [facility] if: BG persistently < 70  I reviewed the Rule of 15 for the treatment of hypoglycemia in detail with the patient. Literature supplied.   2) Diabetic complications:  Eye: Does  have known diabetic  retinopathy.  Neuro/ Feet: Does not have known diabetic peripheral neuropathy. Renal: Patient does  have known baseline CKD. He is not on an ACEI/ARB at present.  3) Dyslipidemia :   - LDL acceptable  - Continue Rosuvastatin 20 mg daily   4)CKD III:  -He was started on losartan on his last visit with me, he is tolerating it well  Medication  Start losartan 25 mg daily  Follow-up in 6 months Signed electronically by: Lyndle Herrlich, MD  [facility] Endocrinology  [facility] [address] [place], [place] [address] Phone: [phone] FAX: [phone]   CC: Emilio Aspen, MD [address], [address] [place] [address] Phone: [phone]  Fax: [phone]    Return to Endocrinology clinic as below: Future Appointments  Date Time Provider Department Center  06/29/2022  9:00 AM [facility]

## 2022-06-22 NOTE — Patient Instructions (Signed)

## 2022-06-24 DIAGNOSIS — Z419 Encounter for procedure for purposes other than remedying health state, unspecified: Secondary | ICD-10-CM | POA: Diagnosis not present

## 2022-06-26 ENCOUNTER — Encounter: Payer: Self-pay | Admitting: Internal Medicine

## 2022-06-29 ENCOUNTER — Ambulatory Visit (INDEPENDENT_AMBULATORY_CARE_PROVIDER_SITE_OTHER): Payer: 59 | Admitting: Podiatry

## 2022-06-29 DIAGNOSIS — M216X1 Other acquired deformities of right foot: Secondary | ICD-10-CM | POA: Diagnosis not present

## 2022-06-29 DIAGNOSIS — M21962 Unspecified acquired deformity of left lower leg: Secondary | ICD-10-CM

## 2022-06-29 DIAGNOSIS — M216X2 Other acquired deformities of left foot: Secondary | ICD-10-CM | POA: Diagnosis not present

## 2022-06-29 DIAGNOSIS — Q666 Other congenital valgus deformities of feet: Secondary | ICD-10-CM

## 2022-06-29 DIAGNOSIS — M21961 Unspecified acquired deformity of right lower leg: Secondary | ICD-10-CM

## 2022-06-29 NOTE — Progress Notes (Signed)
Patient's orthotics were not here.  We will call him back to schedule an appointment

## 2022-07-24 DIAGNOSIS — Z419 Encounter for procedure for purposes other than remedying health state, unspecified: Secondary | ICD-10-CM | POA: Diagnosis not present

## 2022-08-24 DIAGNOSIS — Z419 Encounter for procedure for purposes other than remedying health state, unspecified: Secondary | ICD-10-CM | POA: Diagnosis not present

## 2022-09-24 DIAGNOSIS — Z419 Encounter for procedure for purposes other than remedying health state, unspecified: Secondary | ICD-10-CM | POA: Diagnosis not present

## 2022-10-02 ENCOUNTER — Other Ambulatory Visit: Payer: Self-pay

## 2022-10-02 ENCOUNTER — Emergency Department
Admission: EM | Admit: 2022-10-02 | Discharge: 2022-10-02 | Disposition: A | Discharge status: Home / Self Care | Payer: 59 | Attending: Emergency Medicine | Admitting: Emergency Medicine

## 2022-10-02 DIAGNOSIS — R739 Hyperglycemia, unspecified: Secondary | ICD-10-CM

## 2022-10-02 DIAGNOSIS — Z794 Long term (current) use of insulin: Secondary | ICD-10-CM | POA: Diagnosis not present

## 2022-10-02 DIAGNOSIS — E1122 Type 2 diabetes mellitus with diabetic chronic kidney disease: Secondary | ICD-10-CM | POA: Insufficient documentation

## 2022-10-02 DIAGNOSIS — N183 Chronic kidney disease, stage 3 unspecified: Secondary | ICD-10-CM | POA: Diagnosis not present

## 2022-10-02 DIAGNOSIS — E1165 Type 2 diabetes mellitus with hyperglycemia: Secondary | ICD-10-CM | POA: Diagnosis not present

## 2022-10-02 LAB — HEPATIC FUNCTION PANEL
ALT: 33 U/L (ref 0–44)
AST: 21 U/L (ref 15–41)
Albumin: 4 g/dL (ref 3.5–5.0)
Alkaline Phosphatase: 101 U/L (ref 38–126)
Bilirubin, Direct: <0.1 mg/dL (ref 0.0–0.2)
Total Bilirubin: 0.8 mg/dL (ref 0.3–1.2)
Total Protein: 7.9 g/dL (ref 6.5–8.1)

## 2022-10-02 LAB — BLOOD GAS, VENOUS
Acid-base deficit: 3.6 mmol/L — ABNORMAL HIGH (ref 0.0–2.0)
Bicarbonate: 23.1 mmol/L (ref 20.0–28.0)
O2 Saturation: 80.4 %
Patient temperature: 37
pCO2, Ven: 47 mmHg (ref 44–60)
pH, Ven: 7.3 (ref 7.25–7.43)
pO2, Ven: 48 mmHg — ABNORMAL HIGH (ref 32–45)

## 2022-10-02 LAB — BASIC METABOLIC PANEL
Anion gap: 9 (ref 5–15)
BUN: 40 mg/dL — ABNORMAL HIGH (ref 6–20)
CO2: 23 mmol/L (ref 22–32)
Calcium: 9.3 mg/dL (ref 8.9–10.3)
Chloride: 99 mmol/L (ref 98–111)
Creatinine, Ser: 1.9 mg/dL — ABNORMAL HIGH (ref 0.61–1.24)
GFR, Estimated: 45 mL/min — ABNORMAL LOW (ref >60)
Glucose, Bld: 516 mg/dL (ref 70–99)
Potassium: 5 mmol/L (ref 3.5–5.1)
Sodium: 131 mmol/L — ABNORMAL LOW (ref 135–145)

## 2022-10-02 LAB — CBC
HCT: 37.9 % — ABNORMAL LOW (ref 39.0–52.0)
Hemoglobin: 12 g/dL — ABNORMAL LOW (ref 13.0–17.0)
MCH: 25.9 pg — ABNORMAL LOW (ref 26.0–34.0)
MCHC: 31.7 g/dL (ref 30.0–36.0)
MCV: 81.7 fL (ref 80.0–100.0)
Platelets: 195 10*3/uL (ref 150–400)
RBC: 4.64 MIL/uL (ref 4.22–5.81)
RDW: 13.8 % (ref 11.5–15.5)
WBC: 6.5 10*3/uL (ref 4.0–10.5)
nRBC: 0 % (ref 0.0–0.2)

## 2022-10-02 LAB — CBG MONITORING, ED
Glucose-Capillary: 451 mg/dL — ABNORMAL HIGH (ref 70–99)
Glucose-Capillary: 203 mg/dL — ABNORMAL HIGH (ref 70–99)

## 2022-10-02 LAB — TROPONIN I (HIGH SENSITIVITY): Troponin I (High Sensitivity): 3 ng/L (ref <18)

## 2022-10-02 MED ORDER — INSULIN ASPART 100 UNIT/ML IJ SOLN
10.0000 [IU] | Freq: Once | INTRAMUSCULAR | Status: AC
  Administered 2022-10-02: 10 [IU] via INTRAVENOUS
  Filled 2022-10-02: qty 1

## 2022-10-02 MED ORDER — SODIUM CHLORIDE 0.9 % IV BOLUS
1000.0000 mL | Freq: Once | INTRAVENOUS | Status: AC
  Administered 2022-10-02: 1000 mL via INTRAVENOUS

## 2022-10-02 NOTE — ED Provider Notes (Signed)
[facility] Provider Note    Event Date/Time   First MD Initiated Contact with Patient 10/02/22 1509     (approximate)  History   Chief Complaint: Abnormal Labs  HPI  James [name] is a 42 y.o. male with a past medical history of diabetes, CKD stage III, presents to the emergency department for abnormal lab work.  According to the patient he went to his nephrology appointment today, routine appointment.  Patient states he was called back saying that his labs were abnormal with elevated blood glucose and elevated potassium and the patient was sent to the emergency department for evaluation.  Patient states he has been feeling somewhat fatigued today.  Patient states he normally wears an insulin pump but states that malfunctioned yesterday so he has been without his insulin pump since yesterday.  Physical Exam   Triage Vital Signs: ED Triage Vitals  Encounter Vitals Group     BP 10/02/22 1456 131/87     Systolic BP Percentile --      Diastolic BP Percentile --      Pulse Rate 10/02/22 1456 84     Resp 10/02/22 1456 16     Temp 10/02/22 1459 97.9 F (36.6 C)     Temp Source 10/02/22 1459 Oral     SpO2 10/02/22 1456 100 %     Weight 10/02/22 1457 171 lb 15.3 oz (78 kg)     Height 10/02/22 1457 6\' 2"  (1.88 m)     Head Circumference --      Peak Flow --      Pain Score 10/02/22 1457 0     Pain Loc --      Pain Education --      Exclude from Growth Chart --     Most recent vital signs: Vitals:   10/02/22 1456 10/02/22 1459  BP: 131/87   Pulse: 84   Resp: 16   Temp:  97.9 F (36.6 C)  SpO2: 100%     General: Awake, no distress.  CV:  Good peripheral perfusion.  Regular rate and rhythm  Resp:  Normal effort.  Equal breath sounds bilaterally.  Abd:  No distention.  Soft, nontender.  No rebound or guarding.  ED Results / Procedures / Treatments   EKG  EKG viewed and interpreted by myself shows a normal sinus rhythm 84 bpm with a narrow QRS,  normal axis, normal intervals, nonspecific with no concerning ST changes.   MEDICATIONS ORDERED IN ED: Medications  sodium chloride 0.9 % bolus 1,000 mL (has no administration in time range)     IMPRESSION / MDM / ASSESSMENT AND PLAN / ED COURSE  I reviewed the triage vital signs and the nursing notes.  Patient's presentation is most consistent with acute presentation with potential threat to life or bodily function.  Patient presents to the emergency department for abnormal lab work sent from nephrology for an elevated potassium and glucose level.  Unable to see the results in care everywhere, we will recheck labs to confirm values, we will begin IV hydration.  No EKG changes evident on his current EKG.  Patient's labs have resulted showing a VBG pH of 7.3 blood glucose is 516 but normal anion gap.  Creatinine slightly worse than baseline but not concerning Lycelle.  Patient CBC is reassuring, patient's potassium is reassuringly normal which is our major concern today.  Patient given insulin and fluids blood sugars now down to 203.  I discussed with the patient the  importance of continuing to check his blood sugar and taking his insulin appropriately as he awaits for his new insulin pump.  Patient agreeable to plan of care.  Given the patient's otherwise reassuring workup I believe the patient safe for discharge home with outpatient follow-up.  FINAL CLINICAL IMPRESSION(S) / ED DIAGNOSES   Hyperglycemia    Note:  This document was prepared using Dragon voice recognition software and may include unintentional dictation errors.   Minna Antis, MD 10/02/22 [phone]

## 2022-10-02 NOTE — ED Notes (Signed)
Spoke with PA Lillia Abed, states potassium is 7.4 and glucose 756.

## 2022-10-02 NOTE — ED Triage Notes (Signed)
Pt here with abnormal labs, states his potassium is elevated but unsure of level. Pt states he has stage 3 kidney disease. Pt denies pain.

## 2022-10-03 ENCOUNTER — Telehealth: Payer: Self-pay | Admitting: Internal Medicine

## 2022-10-03 ENCOUNTER — Other Ambulatory Visit: Payer: Self-pay | Admitting: Internal Medicine

## 2022-10-03 MED ORDER — DEXCOM G6 TRANSMITTER MISC: 3 refills | Status: DC

## 2022-10-03 NOTE — Telephone Encounter (Signed)
Patient using the G6 not G7

## 2022-10-03 NOTE — Telephone Encounter (Signed)
Prescription has been sent.

## 2022-10-03 NOTE — Telephone Encounter (Signed)
Patient advising his Dexcom G6 transmitter has no refills.please advise.

## 2022-10-24 DIAGNOSIS — Z419 Encounter for procedure for purposes other than remedying health state, unspecified: Secondary | ICD-10-CM | POA: Diagnosis not present

## 2022-11-24 DIAGNOSIS — Z419 Encounter for procedure for purposes other than remedying health state, unspecified: Secondary | ICD-10-CM | POA: Diagnosis not present

## 2022-11-28 ENCOUNTER — Ambulatory Visit: Payer: 59 | Admitting: Internal Medicine

## 2022-11-28 NOTE — Progress Notes (Deleted)
Name: James [name]  MRN/ DOB: [id number], February 27, 1980   Age/ Sex: 42 y.o., male    PCP: Emilio Aspen, MD   Reason for Endocrinology Evaluation: Type 1 Diabetes Mellitus     Date of Initial Endocrinology Visit: 12/21/2021    PATIENT IDENTIFIER: James [name] is a 42 y.o. male with a past medical history of T1DM. The patient presented for initial endocrinology clinic visit on 12/21/2021 for consultative assistance with his diabetes management.    HPI: James [name] was    Diagnosed with DM at age 21 Prior Medications tried/Intolerance: has been on the pump for years  Hemoglobin A1c has ranged from 8.3% in 2022, peaking at 9.2% in 2023.     SUBJECTIVE:   During the last visit (06/22/2022): 10.4%  Today (11/28/22): James [name] checks his  blood sugars multiple  times daily. The patient has  had hypoglycemic episodes since the last clinic visit. The patient is  symptomatic with these episodes.   Patient presented to the ED with hypoglycemia 10/02/2022    This patient with type 1 diabetes is treated with Tandem  (insulin pump). During the visit the pump basal and bolus doses were reviewed including carb/insulin rations and supplemental doses. The clinical list was updated. The glucose meter download was reviewed in detail to determine if the current pump settings are providing the best glycemic control without excessive hypoglycemia.    Pump and meter download:    Pump   Tandem   Settings   Insulin type   Novolog    Basal rate       0000 0.700 u/h    0600 0.875   1700 0.790   2130 0.800          I:C ratio       0000 1:1 Enter #4 g TIDQAC                  Sensitivity       0000  50      Goal       0000  110              Type & Model of Pump: Tandem  Insulin Type: Currently using Novolog .  There is no height or weight on file to calculate BMI.  PUMP STATISTICS: Average BG: 244  Average Daily Carbs (g): 0 Average Total Daily Insulin:  44.42  Average Daily Basal: 29 (55 %) Average Daily Bolus: 24 (45 %)    HOME DIABETES REGIMEN: Novolog  Losartan 25 mg daily    Statin: yes ACE-I/ARB: no   CONTINUOUS GLUCOSE MONITORING RECORD INTERPRETATION    Dates of Recording: 5/17-5/30/2024  Sensor description:dexcom G6  Results statistics:   CGM use % of time 81  Average and SD 244/94  Time in range     56   %  % Time Above 180 40  % Time Below target 4   Glycemic patterns summary: BG's trend down overnight, and increased throughout the day  Hyperglycemic episodes postprandial  Hypoglycemic episodes occurred rare after bolus  Overnight periods: Trends down to optimal   DIABETIC COMPLICATIONS: Microvascular complications:  S/P left great toe amputation, retinopathy B/L ( he is on injection)  Denies: neuropathy Last eye exam: Completed 11/28/ 2023  Macrovascular complications:   Denies: CAD, PVD, CVA   PAST HISTORY: Past Medical History:  Past Medical History:  Diagnosis Date   Diabetes mellitus without complication (HCC)    Past Surgical History:  Past Surgical  History:  Procedure Laterality Date   AMPUTATION TOE Left 03/13/2021   Procedure: AMPUTATION BIG TOE;  Surgeon: Candelaria Stagers, DPM;  Location: [facility] ORS;  Service: Podiatry;  Laterality: Left;   COCHLEAR IMPLANT  05/27/2020   [facility]    Social History:  reports that he has never smoked. He has never used smokeless tobacco. He reports that he does not drink alcohol and does not use drugs. Family History:  Family History  Problem Relation Age of Onset   Diabetes Father      HOME MEDICATIONS: Allergies as of 11/28/2022   No Known Allergies      Medication List        Accurate as of November 28, 2022  7:38 AM. If you have any questions, ask your nurse or doctor.          acetaminophen 325 MG tablet Commonly known as: TYLENOL Take 2 tablets (650 mg total) by mouth every 6 (six) hours as needed for mild pain (or Fever >/= 101).    cholecalciferol 25 MCG (1000 UNIT) tablet Commonly known as: VITAMIN D3 Take 1,000 Units by mouth daily.   Dexcom G6 Transmitter Misc Change every 90 days   ferrous gluconate 324 MG tablet Commonly known as: FERGON Take 324 mg by mouth. Take 1 tablet by mouth 3 (three) times a week   insulin aspart 100 UNIT/ML injection Commonly known as: novoLOG Max daily 30 units  per pump   losartan 25 MG tablet Commonly known as: COZAAR Take 1 tablet (25 mg total) by mouth daily.   rosuvastatin 20 MG tablet Commonly known as: CRESTOR Take 20 mg by mouth at bedtime.         ALLERGIES: No Known Allergies   REVIEW OF SYSTEMS: A comprehensive ROS was conducted with the patient and is negative except as per HPI     OBJECTIVE:   VITAL SIGNS: There were no vitals taken for this visit.   PHYSICAL EXAM:  General: Pt appears well and is in NAD  Neck: General: Supple without adenopathy or carotid bruits. Thyroid: Thyroid size normal.  No goiter or nodules appreciated.   Lungs: Clear with good BS bilat with no rales, rhonchi, or wheezes  Heart: RRR   Abdomen:  soft, nontender  Extremities: No pretibial edema  Neuro: MS is good with appropriate affect, pt is alert and Ox3    DM foot exam: 12/21/2021   The skin of the feet is without sores or ulcerations, S/P left great toe amputation The pedal pulses are 2+ on right and 2+ on left. The sensation is intact to a screening 5.07, 10 gram monofilament bilaterally  DATA REVIEWED:  Lab Results  Component Value Date   HGBA1C 10.4 (H) 04/10/2022   HGBA1C 8.1 (A) 12/21/2021   HGBA1C 9.2 (H) 03/12/2021      Latest Reference Range & Units 10/02/22 15:02  Sodium 135 - 145 mmol/L 131 (L)  Potassium 3.5 - 5.1 mmol/L 5.0  Chloride 98 - 111 mmol/L 99  CO2 22 - 32 mmol/L 23  Glucose 70 - 99 mg/dL 811 (HH)  BUN 6 - 20 mg/dL 40 (H)  Creatinine 9.14 - 1.24 mg/dL 7.82 (H)  Calcium 8.9 - 10.3 mg/dL 9.3  Anion gap 5 - 15  9  Alkaline  Phosphatase 38 - 126 U/L 101  Albumin 3.5 - 5.0 g/dL 4.0  AST 15 - 41 U/L 21  ALT 0 - 44 U/L 33  Total Protein 6.5 - 8.1 g/dL 7.9  Bilirubin, Direct 0.0 - 0.2 mg/dL <0.9  Indirect Bilirubin 0.3 - 0.9 mg/dL NOT CALCULATED  Total Bilirubin 0.3 - 1.2 mg/dL 0.8  GFR, Estimated >81 mL/min 45 (L)  (HH): Data is critically high (L): Data is abnormally low (H): Data is abnormally high   ASSESSMENT / PLAN / RECOMMENDATIONS:   1) Type 1 Diabetes Mellitus, Poorly controlled, With CKD III complications - Most recent A1c of 10.4%. Goal A1c < 7.0 %.    -His A1c has trended up from 8.1% to 10.4%, the patient is unable to tell me if anything changed with his pump/CGM use or lifestyle -In reviewing his CGM/pump download, the patient does not bolus for any carbohydrates at all, he depends on IQ technology for correction as well as basal insulin -I have given the patient a standing dose of carbohydrates to enter with each meal, #4, and I have changed his insulin to carb ratio to 1:1  -I have also increase his basal rate in the evening, no change to basal rate during the day as I am hoping once he starts entering this 4 g, it will correct postprandial hyperglycemia -A prescription for Dexcom G7 was sent to his pharmacy, patient advised to contact tandem to upgrade the software to the new Dexcom  Pump   Tandem   Settings   Insulin type   Novolog    Basal rate       0000 0.700 u/h    0600 0.875   1700 0.790   2130 0.800          I:C ratio       0000 1:1 Enter #4 g TIDQAC                  Sensitivity       0000  50      Goal       0000  110          MEDICATIONS: NovoLog  EDUCATION / INSTRUCTIONS: BG monitoring instructions: Patient is instructed to check his blood sugars 3 times a day, before meals . Call [facility] if: BG persistently < 70  I reviewed the Rule of 15 for the treatment of hypoglycemia in detail with the patient. Literature supplied.   2) Diabetic  complications:  Eye: Does  have known diabetic retinopathy.  Neuro/ Feet: Does not have known diabetic peripheral neuropathy. Renal: Patient does  have known baseline CKD. He is not on an ACEI/ARB at present.  3) Dyslipidemia :   - LDL acceptable  - Continue Rosuvastatin 20 mg daily   4)CKD III:  -He was started on losartan on his last visit with me, he is tolerating it well  Medication  Start losartan 25 mg daily  Follow-up in 6 months Signed electronically by: Lyndle Herrlich, MD  [facility] Endocrinology  [facility] [address] [place], [place] [address] Phone: [phone] FAX: [phone]   CC: Emilio Aspen, MD [address] [place] [address] Phone: [phone]  Fax: [phone]    Return to Endocrinology clinic as below: Future Appointments  Date Time Provider Department Center  11/28/2022 11:50 AM James [name], Konrad Dolores, MD [id number] None

## 2022-12-24 DIAGNOSIS — Z419 Encounter for procedure for purposes other than remedying health state, unspecified: Secondary | ICD-10-CM | POA: Diagnosis not present

## 2023-01-12 ENCOUNTER — Other Ambulatory Visit: Payer: Self-pay

## 2023-01-12 DIAGNOSIS — Z794 Long term (current) use of insulin: Secondary | ICD-10-CM | POA: Insufficient documentation

## 2023-01-12 DIAGNOSIS — M79604 Pain in right leg: Secondary | ICD-10-CM | POA: Diagnosis present

## 2023-01-12 DIAGNOSIS — E1065 Type 1 diabetes mellitus with hyperglycemia: Secondary | ICD-10-CM | POA: Diagnosis not present

## 2023-01-12 DIAGNOSIS — R944 Abnormal results of kidney function studies: Secondary | ICD-10-CM | POA: Diagnosis not present

## 2023-01-12 NOTE — ED Triage Notes (Signed)
Pt presents to ER with mother with c/o hyperglycemia and RLE pain.  Pt reports that he has insulin pump, but states the batteries died sometimes today while he was sleeping, and states he felt like his blood sugar was getting high.  Reports increased thirst.  Pt states he is having pain in his right calf, and that it hurts worse when moving or raising his foot.  Pt is otherwise A&O x4 and in NAD.    Pt gave himself 6 units regular sq insulin around 1hr ago.

## 2023-01-13 ENCOUNTER — Emergency Department: Payer: 59

## 2023-01-13 ENCOUNTER — Emergency Department
Admission: EM | Admit: 2023-01-13 | Discharge: 2023-01-13 | Disposition: A | Discharge status: Home / Self Care | Payer: 59 | Attending: Emergency Medicine | Admitting: Emergency Medicine

## 2023-01-13 DIAGNOSIS — E1065 Type 1 diabetes mellitus with hyperglycemia: Secondary | ICD-10-CM

## 2023-01-13 DIAGNOSIS — M79604 Pain in right leg: Secondary | ICD-10-CM

## 2023-01-13 LAB — URINALYSIS, ROUTINE W REFLEX MICROSCOPIC
Bacteria, UA: NONE SEEN
Bilirubin Urine: NEGATIVE
Glucose, UA: >=500 mg/dL — AB
Hgb urine dipstick: NEGATIVE
Ketones, ur: 5 mg/dL — AB
Leukocytes,Ua: NEGATIVE
Nitrite: NEGATIVE
Protein, ur: NEGATIVE mg/dL
Specific Gravity, Urine: 1.022 (ref 1.005–1.030)
pH: 5 (ref 5.0–8.0)

## 2023-01-13 LAB — BASIC METABOLIC PANEL
Anion gap: 8 (ref 5–15)
BUN: 47 mg/dL — ABNORMAL HIGH (ref 6–20)
CO2: 26 mmol/L (ref 22–32)
Calcium: 8.9 mg/dL (ref 8.9–10.3)
Chloride: 98 mmol/L (ref 98–111)
Creatinine, Ser: 2.28 mg/dL — ABNORMAL HIGH (ref 0.61–1.24)
GFR, Estimated: 36 mL/min — ABNORMAL LOW (ref >60)
Glucose, Bld: 631 mg/dL (ref 70–99)
Potassium: 4 mmol/L (ref 3.5–5.1)
Sodium: 132 mmol/L — ABNORMAL LOW (ref 135–145)

## 2023-01-13 LAB — CBC
HCT: 37.1 % — ABNORMAL LOW (ref 39.0–52.0)
Hemoglobin: 11.7 g/dL — ABNORMAL LOW (ref 13.0–17.0)
MCH: 26.4 pg (ref 26.0–34.0)
MCHC: 31.5 g/dL (ref 30.0–36.0)
MCV: 83.6 fL (ref 80.0–100.0)
Platelets: 143 10*3/uL — ABNORMAL LOW (ref 150–400)
RBC: 4.44 MIL/uL (ref 4.22–5.81)
RDW: 13 % (ref 11.5–15.5)
WBC: 5.9 10*3/uL (ref 4.0–10.5)
nRBC: 0 % (ref 0.0–0.2)

## 2023-01-13 LAB — BETA-HYDROXYBUTYRIC ACID: Beta-Hydroxybutyric Acid: 0.64 mmol/L — ABNORMAL HIGH (ref 0.05–0.27)

## 2023-01-13 LAB — CBG MONITORING, ED
Glucose-Capillary: 280 mg/dL — ABNORMAL HIGH (ref 70–99)
Glucose-Capillary: 422 mg/dL — ABNORMAL HIGH (ref 70–99)
Glucose-Capillary: 521 mg/dL (ref 70–99)

## 2023-01-13 MED ORDER — INSULIN ASPART 100 UNIT/ML IJ SOLN
10.0000 [IU] | Freq: Once | INTRAMUSCULAR | Status: AC
  Administered 2023-01-13: 10 [IU] via SUBCUTANEOUS
  Filled 2023-01-13: qty 1

## 2023-01-13 MED ORDER — LACTATED RINGERS IV BOLUS
1000.0000 mL | Freq: Once | INTRAVENOUS | Status: AC
  Administered 2023-01-13: 1000 mL via INTRAVENOUS

## 2023-01-13 NOTE — ED Provider Notes (Signed)
[facility] Provider Note    Event Date/Time   First MD Initiated Contact with Patient 01/13/23 [phone]     (approximate)   History   Foot Pain and Hyperglycemia   HPI James [name] is a 42 y.o. male with type 1 diabetes who presents for evaluation of pain in his right calf.  He said that he has been working at [employer] and has been working long hours for the holidays.  He started to have some pain in his right calf that sometimes radiates down into his right foot.  No swelling, no discoloration or redness.  No history of blood clots in the legs of the lungs and he has been active and alert rather than immobile.  No recent trips, no surgeries.  Of note, the patient's blood glucose monitor was not working adequately when he arrived and the patient said he had been running high.  He has an insulin pump in place.     Physical Exam   Triage Vital Signs: ED Triage Vitals  Encounter Vitals Group     BP 01/12/23 2342 118/79     Systolic BP Percentile --      Diastolic BP Percentile --      Pulse Rate 01/12/23 2342 93     Resp 01/12/23 2342 18     Temp 01/12/23 2342 97.7 F (36.5 C)     Temp Source 01/12/23 2342 Oral     SpO2 01/12/23 2342 99 %     Weight 01/12/23 2355 72.6 kg (160 lb)     Height 01/12/23 2355 1.88 m (6\' 2" )     Head Circumference --      Peak Flow --      Pain Score 01/12/23 2355 8     Pain Loc --      Pain Education --      Exclude from Growth Chart --     Most recent vital signs: Vitals:   01/13/23 0600 01/13/23 0630  BP: (!) 150/87 131/86  Pulse: 86 81  Resp:  18  Temp:    SpO2: 100% 100%    General: Awake, no distress.  Generally well-appearing. CV:  Good peripheral perfusion.  Regular rate and rhythm. Resp:  Normal effort. Speaking easily and comfortably, no accessory muscle usage nor intercostal retractions.   Abd:  No distention.  No tenderness to palpation. Other:  Normal-appearing bilateral lower extremities.  Some  tenderness to manipulation of the calf muscle, but no palpable deformities along the venous distribution, no swelling when compared to the left, no discoloration of the skin or suggestion of infectious process or poor venous return.   ED Results / Procedures / Treatments   Labs (all labs ordered are listed, but only abnormal results are displayed) Labs Reviewed  CBC - Abnormal; Notable for the following components:      Result Value   Hemoglobin 11.7 (*)    HCT 37.1 (*)    Platelets 143 (*)    All other components within normal limits  URINALYSIS, ROUTINE W REFLEX MICROSCOPIC - Abnormal; Notable for the following components:   Color, Urine COLORLESS (*)    APPearance CLEAR (*)    Glucose, UA >=500 (*)    Ketones, ur 5 (*)    All other components within normal limits  BASIC METABOLIC PANEL - Abnormal; Notable for the following components:   Sodium 132 (*)    Glucose, Bld 631 (*)    BUN 47 (*)  Creatinine, Ser 2.28 (*)    GFR, Estimated 36 (*)    All other components within normal limits  BETA-HYDROXYBUTYRIC ACID - Abnormal; Notable for the following components:   Beta-Hydroxybutyric Acid 0.64 (*)    All other components within normal limits  CBG MONITORING, ED - Abnormal; Notable for the following components:   Glucose-Capillary 521 (*)    All other components within normal limits  CBG MONITORING, ED - Abnormal; Notable for the following components:   Glucose-Capillary 422 (*)    All other components within normal limits  CBG MONITORING, ED - Abnormal; Notable for the following components:   Glucose-Capillary 280 (*)    All other components within normal limits      RADIOLOGY I viewed and interpreted the patient's tibia/fibula x-rays.  No evidence of fracture or dislocation   PROCEDURES:  Critical Care performed: No  Procedures    IMPRESSION / MDM / ASSESSMENT AND PLAN / ED COURSE  I reviewed the triage vital signs and the nursing notes.                               Differential diagnosis includes, but is not limited to, hyperglycemia, DKA, musculoskeletal strain, DVT, electrolyte abnormality.  Patient's presentation is most consistent with acute presentation with potential threat to life or bodily function.  Labs/studies ordered: Tibia/fibula x-rays, CBC, basic metabolic panel, UA, beta hydroxybutyric acid  Interventions/Medications given:  Medications  lactated ringers bolus 1,000 mL (0 mLs Intravenous Stopped 01/13/23 0719)  insulin aspart (novoLOG) injection 10 Units (10 Units Subcutaneous Given 01/13/23 0458)    (Note:  hospital course my include additional interventions and/or labs/studies not listed above.)   Patient initially was found to have a glucose of 631.  His creatinine is elevated but not substantially above his baseline which is usually around 2.  His anion gap was normal which was reassuring.  He had a slightly elevated beta hydroxybutyric acid and a few ketones in his urine but he is tolerating oral intake without difficulty and not having any symptoms that would suggest DKA.  I provided 1 L crystalloid IV fluids and 10 units of regular insulin subcutaneously and his glucose improved to 280.  He also does continue with the insulin pump but was just having issues with the monitor itself.  He is not worried about the blood glucose level and is satisfied with the drop in the glucose level after treatment in the ED.  Regarding the leg, there is no physical sign or symptom of injury nor of vascular problem.  I talked with the patient extensively, and I offered an ultrasound to rule out DVT, but I explained that I have a very low suspicion (Wells score for DVT is -2).  He would prefer not to obtain the ultrasound tonight and we will just treat it as a muscle strain.  I gave him strict return precautions to return to the ED for further evaluation if he develops new or worsening symptoms.         FINAL CLINICAL IMPRESSION(S) / ED  DIAGNOSES   Final diagnoses:  Right leg pain  Hyperglycemia due to type 1 diabetes mellitus (HCC)     Rx / DC Orders   ED Discharge Orders     None        Note:  This document was prepared using Dragon voice recognition software and may include unintentional dictation errors.   Loleta Rose, MD  01/13/23 0804  

## 2023-01-13 NOTE — Discharge Instructions (Signed)
As we discussed, your evaluation was generally reassuring.  Most likely have a muscle strain in your right calf due to overuse.  You declined the ultrasound tonight which we think is reasonable and appropriate given your very low risk for deep vein thrombosis (DVT), or blood clot within the veins of your leg.  However, if you develop new or worsening symptoms, please return to the emergency department for additional follow-up and evaluation.  Regardless, we recommend you follow-up with your regular doctor to discuss your diabetes as well as the pain in your leg.

## 2023-01-18 ENCOUNTER — Telehealth: Payer: Self-pay | Admitting: Internal Medicine

## 2023-01-18 ENCOUNTER — Other Ambulatory Visit: Payer: Self-pay

## 2023-01-18 DIAGNOSIS — E1065 Type 1 diabetes mellitus with hyperglycemia: Secondary | ICD-10-CM

## 2023-01-18 MED ORDER — DEXCOM G6 SENSOR MISC
1.0000 | 3 refills | Status: AC
Start: 2023-01-18 — End: 2023-01-28

## 2023-01-18 NOTE — Telephone Encounter (Signed)
Patient advising that his rx was sent in for [facility] G7 , he needs the Dexcom G6 sensor, please send updated rx to [facility] in [place].

## 2023-01-24 DIAGNOSIS — Z419 Encounter for procedure for purposes other than remedying health state, unspecified: Secondary | ICD-10-CM | POA: Diagnosis not present

## 2023-01-30 ENCOUNTER — Other Ambulatory Visit (HOSPITAL_COMMUNITY): Payer: Self-pay

## 2023-02-24 DIAGNOSIS — Z419 Encounter for procedure for purposes other than remedying health state, unspecified: Secondary | ICD-10-CM | POA: Diagnosis not present

## 2023-02-26 ENCOUNTER — Other Ambulatory Visit: Payer: Self-pay

## 2023-02-26 ENCOUNTER — Observation Stay
Admission: EM | Admit: 2023-02-26 | Discharge: 2023-02-28 | Disposition: A | Discharge status: Home / Self Care | Payer: 59 | Attending: Student | Admitting: Student

## 2023-02-26 DIAGNOSIS — E1010 Type 1 diabetes mellitus with ketoacidosis without coma: Secondary | ICD-10-CM | POA: Diagnosis not present

## 2023-02-26 DIAGNOSIS — Z79899 Other long term (current) drug therapy: Secondary | ICD-10-CM | POA: Diagnosis not present

## 2023-02-26 DIAGNOSIS — E1065 Type 1 diabetes mellitus with hyperglycemia: Secondary | ICD-10-CM

## 2023-02-26 DIAGNOSIS — G9340 Encephalopathy, unspecified: Secondary | ICD-10-CM | POA: Insufficient documentation

## 2023-02-26 DIAGNOSIS — N183 Chronic kidney disease, stage 3 unspecified: Secondary | ICD-10-CM | POA: Insufficient documentation

## 2023-02-26 DIAGNOSIS — R739 Hyperglycemia, unspecified: Principal | ICD-10-CM

## 2023-02-26 DIAGNOSIS — E1022 Type 1 diabetes mellitus with diabetic chronic kidney disease: Secondary | ICD-10-CM | POA: Insufficient documentation

## 2023-02-26 DIAGNOSIS — E785 Hyperlipidemia, unspecified: Secondary | ICD-10-CM | POA: Insufficient documentation

## 2023-02-26 DIAGNOSIS — I129 Hypertensive chronic kidney disease with stage 1 through stage 4 chronic kidney disease, or unspecified chronic kidney disease: Secondary | ICD-10-CM | POA: Insufficient documentation

## 2023-02-26 DIAGNOSIS — I10 Essential (primary) hypertension: Secondary | ICD-10-CM | POA: Diagnosis present

## 2023-02-26 DIAGNOSIS — E1110 Type 2 diabetes mellitus with ketoacidosis without coma: Secondary | ICD-10-CM | POA: Diagnosis present

## 2023-02-26 LAB — CBC
HCT: 38.8 % — ABNORMAL LOW (ref 39.0–52.0)
Hemoglobin: 11.9 g/dL — ABNORMAL LOW (ref 13.0–17.0)
MCH: 26.7 pg (ref 26.0–34.0)
MCHC: 30.7 g/dL (ref 30.0–36.0)
MCV: 87 fL (ref 80.0–100.0)
Platelets: 146 10*3/uL — ABNORMAL LOW (ref 150–400)
RBC: 4.46 MIL/uL (ref 4.22–5.81)
RDW: 13.8 % (ref 11.5–15.5)
WBC: 10.8 10*3/uL — ABNORMAL HIGH (ref 4.0–10.5)
nRBC: 0 % (ref 0.0–0.2)

## 2023-02-26 LAB — CBG MONITORING, ED
Glucose-Capillary: >600 mg/dL (ref 70–99)
Glucose-Capillary: >600 mg/dL (ref 70–99)
Glucose-Capillary: >600 mg/dL (ref 70–99)
Glucose-Capillary: 584 mg/dL (ref 70–99)
Glucose-Capillary: >600 mg/dL (ref 70–99)
Glucose-Capillary: >600 mg/dL (ref 70–99)
Glucose-Capillary: 579 mg/dL (ref 70–99)
Glucose-Capillary: 412 mg/dL — ABNORMAL HIGH (ref 70–99)
Glucose-Capillary: >600 mg/dL (ref 70–99)
Glucose-Capillary: 416 mg/dL — ABNORMAL HIGH (ref 70–99)
Glucose-Capillary: 468 mg/dL — ABNORMAL HIGH (ref 70–99)
Glucose-Capillary: 338 mg/dL — ABNORMAL HIGH (ref 70–99)
Glucose-Capillary: 354 mg/dL — ABNORMAL HIGH (ref 70–99)
Glucose-Capillary: 331 mg/dL — ABNORMAL HIGH (ref 70–99)
Glucose-Capillary: 267 mg/dL — ABNORMAL HIGH (ref 70–99)
Glucose-Capillary: 220 mg/dL — ABNORMAL HIGH (ref 70–99)
Glucose-Capillary: 208 mg/dL — ABNORMAL HIGH (ref 70–99)
Glucose-Capillary: 232 mg/dL — ABNORMAL HIGH (ref 70–99)

## 2023-02-26 LAB — BETA-HYDROXYBUTYRIC ACID
Beta-Hydroxybutyric Acid: 7.15 mmol/L — ABNORMAL HIGH (ref 0.05–0.27)
Beta-Hydroxybutyric Acid: 7.99 mmol/L — ABNORMAL HIGH (ref 0.05–0.27)
Beta-Hydroxybutyric Acid: 0.78 mmol/L — ABNORMAL HIGH (ref 0.05–0.27)

## 2023-02-26 LAB — BASIC METABOLIC PANEL
Anion gap: 26 — ABNORMAL HIGH (ref 5–15)
Anion gap: 25 — ABNORMAL HIGH (ref 5–15)
Anion gap: 19 — ABNORMAL HIGH (ref 5–15)
Anion gap: 13 (ref 5–15)
BUN: 57 mg/dL — ABNORMAL HIGH (ref 6–20)
BUN: 57 mg/dL — ABNORMAL HIGH (ref 6–20)
BUN: 53 mg/dL — ABNORMAL HIGH (ref 6–20)
BUN: 49 mg/dL — ABNORMAL HIGH (ref 6–20)
CO2: 17 mmol/L — ABNORMAL LOW (ref 22–32)
CO2: 18 mmol/L — ABNORMAL LOW (ref 22–32)
CO2: 24 mmol/L (ref 22–32)
CO2: 26 mmol/L (ref 22–32)
Calcium: 9.5 mg/dL (ref 8.9–10.3)
Calcium: 9.2 mg/dL (ref 8.9–10.3)
Calcium: 9.7 mg/dL (ref 8.9–10.3)
Calcium: 8.7 mg/dL — ABNORMAL LOW (ref 8.9–10.3)
Chloride: 87 mmol/L — ABNORMAL LOW (ref 98–111)
Chloride: 92 mmol/L — ABNORMAL LOW (ref 98–111)
Chloride: 100 mmol/L (ref 98–111)
Chloride: 106 mmol/L (ref 98–111)
Creatinine, Ser: 2.37 mg/dL — ABNORMAL HIGH (ref 0.61–1.24)
Creatinine, Ser: 2.36 mg/dL — ABNORMAL HIGH (ref 0.61–1.24)
Creatinine, Ser: 2.3 mg/dL — ABNORMAL HIGH (ref 0.61–1.24)
Creatinine, Ser: 2 mg/dL — ABNORMAL HIGH (ref 0.61–1.24)
GFR, Estimated: 34 mL/min — ABNORMAL LOW (ref >60)
GFR, Estimated: 34 mL/min — ABNORMAL LOW (ref >60)
GFR, Estimated: 35 mL/min — ABNORMAL LOW (ref >60)
GFR, Estimated: 42 mL/min — ABNORMAL LOW (ref >60)
Glucose, Bld: 1032 mg/dL (ref 70–99)
Glucose, Bld: 850 mg/dL (ref 70–99)
Glucose, Bld: 528 mg/dL (ref 70–99)
Glucose, Bld: 273 mg/dL — ABNORMAL HIGH (ref 70–99)
Potassium: 6.3 mmol/L (ref 3.5–5.1)
Potassium: 4.6 mmol/L (ref 3.5–5.1)
Potassium: 4.7 mmol/L (ref 3.5–5.1)
Potassium: 4.4 mmol/L (ref 3.5–5.1)
Sodium: 130 mmol/L — ABNORMAL LOW (ref 135–145)
Sodium: 135 mmol/L (ref 135–145)
Sodium: 143 mmol/L (ref 135–145)
Sodium: 145 mmol/L (ref 135–145)

## 2023-02-26 LAB — URINALYSIS, ROUTINE W REFLEX MICROSCOPIC
Bilirubin Urine: NEGATIVE
Glucose, UA: >=500 mg/dL — AB
Hgb urine dipstick: NEGATIVE
Ketones, ur: 20 mg/dL — AB
Leukocytes,Ua: NEGATIVE
Nitrite: NEGATIVE
Protein, ur: NEGATIVE mg/dL
RBC / HPF: 0 RBC/hpf (ref 0–5)
Specific Gravity, Urine: 1.023 (ref 1.005–1.030)
Squamous Epithelial / HPF: 0 /[HPF] (ref 0–5)
pH: 5 (ref 5.0–8.0)

## 2023-02-26 LAB — BLOOD GAS, VENOUS
Acid-base deficit: 9.1 mmol/L — ABNORMAL HIGH (ref 0.0–2.0)
Bicarbonate: 18.4 mmol/L — ABNORMAL LOW (ref 20.0–28.0)
O2 Saturation: 89.5 %
Patient temperature: 37
pCO2, Ven: 45 mm[Hg] (ref 44–60)
pH, Ven: 7.22 — ABNORMAL LOW (ref 7.25–7.43)
pO2, Ven: 60 mm[Hg] — ABNORMAL HIGH (ref 32–45)

## 2023-02-26 LAB — MAGNESIUM: Magnesium: 2.7 mg/dL — ABNORMAL HIGH (ref 1.7–2.4)

## 2023-02-26 LAB — PHOSPHORUS: Phosphorus: 5.2 mg/dL — ABNORMAL HIGH (ref 2.5–4.6)

## 2023-02-26 LAB — TROPONIN I (HIGH SENSITIVITY): Troponin I (High Sensitivity): 4 ng/L (ref <18)

## 2023-02-26 MED ORDER — ONDANSETRON HCL 4 MG/2ML IJ SOLN
4.0000 mg | Freq: Once | INTRAMUSCULAR | Status: AC
  Administered 2023-02-26: 4 mg via INTRAVENOUS
  Filled 2023-02-26: qty 2

## 2023-02-26 MED ORDER — LACTATED RINGERS IV SOLN: INTRAVENOUS | Status: AC

## 2023-02-26 MED ORDER — DEXTROSE IN LACTATED RINGERS 5 % IV SOLN: INTRAVENOUS | Status: DC

## 2023-02-26 MED ORDER — INSULIN REGULAR(HUMAN) IN NACL 100-0.9 UT/100ML-% IV SOLN
INTRAVENOUS | Status: DC
  Administered 2023-02-26: 7.5 [IU]/h via INTRAVENOUS
  Filled 2023-02-26: qty 100

## 2023-02-26 MED ORDER — SODIUM CHLORIDE 0.9 % IV BOLUS
1000.0000 mL | Freq: Once | INTRAVENOUS | Status: AC
  Administered 2023-02-26: 1000 mL via INTRAVENOUS

## 2023-02-26 MED ORDER — SODIUM CHLORIDE 0.9 % IV BOLUS: 1000.0000 mL | Freq: Once | INTRAVENOUS | Status: DC

## 2023-02-26 MED ORDER — DEXTROSE 50 % IV SOLN: 0.0000 mL | INTRAVENOUS | Status: DC | PRN

## 2023-02-26 MED ORDER — INSULIN REGULAR(HUMAN) IN NACL 100-0.9 UT/100ML-% IV SOLN
INTRAVENOUS | Status: DC
  Administered 2023-02-26: 4.5 [IU]/h via INTRAVENOUS
  Filled 2023-02-26: qty 100

## 2023-02-26 MED ORDER — LACTATED RINGERS IV BOLUS: 20.0000 mL/kg | Freq: Once | INTRAVENOUS | Status: DC

## 2023-02-26 MED ORDER — DEXTROSE IN LACTATED RINGERS 5 % IV SOLN: INTRAVENOUS | Status: AC

## 2023-02-26 MED ORDER — LACTATED RINGERS IV BOLUS
1000.0000 mL | Freq: Once | INTRAVENOUS | Status: AC
  Administered 2023-02-26: 1000 mL via INTRAVENOUS

## 2023-02-26 MED ORDER — ENOXAPARIN SODIUM 40 MG/0.4ML IJ SOSY
40.0000 mg | PREFILLED_SYRINGE | INTRAMUSCULAR | Status: DC
  Administered 2023-02-26 – 2023-02-27 (×2): 40 mg via SUBCUTANEOUS
  Filled 2023-02-26 (×2): qty 0.4

## 2023-02-26 NOTE — Assessment & Plan Note (Signed)
Meeting DKA criteria w/ glu 1032, pH 7.22, + ketones  Secondary to glucose monitor malfunction  BHB pending  Start DKA protocol  Diabetic education  Transition to long acting insulin and SSI once AG closes and blood sugar normalizes

## 2023-02-26 NOTE — ED Notes (Signed)
Pt sleeping. No distress, respirations even and unlabored

## 2023-02-26 NOTE — ED Notes (Signed)
First Nurse Note: Pt to ED via [facility] from work for hyperglycemia. Insulin pump stopped working within the last 24 hours, blood sugar is reading high. Pt given Zofran 4 mg IV.   Bp- 112/58   20 G IV left Hand

## 2023-02-26 NOTE — H&P (Signed)
History and Physical    Patient: James [name] Class MVH:[id number] DOB: 01/08/1981 DOA: 02/26/2023 DOS: the patient was seen and examined on 02/26/2023 PCP: Emilio Aspen, MD  Patient coming from: Home  Chief Complaint:  Chief Complaint  Patient presents with   Hyperglycemia   HPI: James [name] is a 43 y.o. male with medical history significant of type 1 diabetes presenting with DKA and encephalopathy.  Limited history in setting of cephalopathy.  Per report, patient with malfunctioning insulin pump.  Unclear duration.  Noted sugars up into the 1000's per patient.  No reported chest pain.  Positive nausea weakness and generalized malaise.  No reported abdominal pain or diarrhea.  No focal hemiparesis or confusion.  No reported alcohol or tobacco use. Presented to the ER afebrile, heart rate 100s, BP stable.  Satting well on room air.  White count 10.8, hemoglobin Levan 0.9, platelets 146, blood sugars greater than 600 on multiple draws.  Bicarb 17, potassium 6.3, creatinine 2.37, urinalysis with positive ketones. Started on DKA protocol.  Review of Systems: As mentioned in the history of present illness. All other systems reviewed and are negative. Past Medical History:  Diagnosis Date   Diabetes mellitus without complication [facility])    Past Surgical History:  Procedure Laterality Date   AMPUTATION TOE Left 03/13/2021   Procedure: AMPUTATION BIG TOE;  Surgeon: Candelaria Stagers, DPM;  Location: [facility] ORS;  Service: Podiatry;  Laterality: Left;   COCHLEAR IMPLANT  05/27/2020   [facility]   Social History:  reports that he has never smoked. He has never used smokeless tobacco. He reports that he does not drink alcohol and does not use drugs.  No Known Allergies  Family History  Problem Relation Age of Onset   Diabetes Father     Prior to Admission medications   Medication Sig Start Date End Date Taking? Authorizing Provider  acetaminophen (TYLENOL) 325 MG tablet Take 2 tablets (650 mg total)  by mouth every 6 (six) hours as needed for mild pain (or Fever >/= 101). 03/14/21   Wieting, Richard, MD  cholecalciferol (VITAMIN D3) 25 MCG (1000 UNIT) tablet Take 1,000 Units by mouth daily.    [provider]  Continuous Glucose Transmitter (DEXCOM G6 TRANSMITTER) MISC Change every 90 days 10/03/22   Shamleffer, Konrad Dolores, MD  ferrous gluconate (FERGON) 324 MG tablet Take 324 mg by mouth. Take 1 tablet by mouth 3 (three) times a week 03/23/22   [provider]  insulin aspart (NOVOLOG) 100 UNIT/ML injection Max daily 30 units  per pump 12/21/21   Shamleffer, Konrad Dolores, MD  losartan (COZAAR) 25 MG tablet Take 1 tablet (25 mg total) by mouth daily. 06/22/22   Shamleffer, Konrad Dolores, MD  rosuvastatin (CRESTOR) 20 MG tablet Take 20 mg by mouth at bedtime. 10/24/21   [provider]    Physical Exam: Vitals:   02/26/23 1020 02/26/23 1021 02/26/23 1152  BP: (!) 158/134  (!) 103/56  Pulse: (!) 104  (!) 111  Resp: 20  (!) 24  Temp: 97.8 F (36.6 C)    TempSrc: Axillary    SpO2: 100%  98%  Weight:  72.6 kg    Physical Exam Constitutional:      Appearance: He is normal weight.  HENT:     Head: Normocephalic and atraumatic.     Mouth/Throat:     Mouth: Mucous membranes are dry.  Eyes:     Pupils: Pupils are equal, round, and reactive to light.  Cardiovascular:  Rate and Rhythm: Normal rate and regular rhythm.  Pulmonary:     Effort: Pulmonary effort is normal.  Abdominal:     General: Bowel sounds are normal.  Musculoskeletal:     Cervical back: Normal range of motion.     Comments:  + generalized weakness    Skin:    General: Skin is dry.  Neurological:     Comments: + generalized lethargy   Psychiatric:        Mood and Affect: Mood normal.     Data Reviewed:  There are no new results to review at this time.  Lab Results  Component Value Date   WBC 10.8 (H) 02/26/2023   HGB 11.9 (L) 02/26/2023   HCT 38.8 (L) 02/26/2023    MCV 87.0 02/26/2023   PLT 146 (L) 02/26/2023   Last metabolic panel Lab Results  Component Value Date   GLUCOSE 1,032 (HH) 02/26/2023   NA 130 (L) 02/26/2023   K 6.3 (HH) 02/26/2023   CL 87 (L) 02/26/2023   CO2 17 (L) 02/26/2023   BUN 57 (H) 02/26/2023   CREATININE 2.37 (H) 02/26/2023   GFRNONAA 34 (L) 02/26/2023   CALCIUM 9.5 02/26/2023   PROT 7.9 10/02/2022   ALBUMIN 4.0 10/02/2022   BILITOT 0.8 10/02/2022   ALKPHOS 101 10/02/2022   AST 21 10/02/2022   ALT 33 10/02/2022   ANIONGAP 26 (H) 02/26/2023    Assessment and Plan: * DKA (diabetic ketoacidosis) (HCC) Meeting DKA criteria w/ glu 1032, pH 7.22, + ketones  Secondary to glucose monitor malfunction  BHB pending  Start DKA protocol  Diabetic education  Transition to long acting insulin and SSI once AG closes and blood sugar normalizes    CKD (chronic kidney disease), stage III (HCC) Creatinine 2.37 with GFR in upper 20s Appears near baseline though clinically dry IV fluid hydration Monitor closely  Acute encephalopathy + generalized lethargy in setting of DKA  Nonfocal neuro exam  Toxic-metabolic etiology  Monitor mentation w/ treatment  Hyperlipidemia Statin   Essential hypertension BP stable  Titrate home regimen        Advance Care Planning:   Code Status: Full Code   Consults: None   Family Communication: No family at the bedside   Severity of Illness: The appropriate patient status for this patient is INPATIENT. Inpatient status is judged to be reasonable and necessary in order to provide the required intensity of service to ensure the patient's safety. The patient's presenting symptoms, physical exam findings, and initial radiographic and laboratory data in the context of their chronic comorbidities is felt to place them at high risk for further clinical deterioration. Furthermore, it is not anticipated that the patient will be medically stable for discharge from the hospital within 2 midnights  of admission.   * I certify that at the point of admission it is my clinical judgment that the patient will require inpatient hospital care spanning beyond 2 midnights from the point of admission due to high intensity of service, high risk for further deterioration and high frequency of surveillance required.*  Author: Floydene Flock, MD 02/26/2023 1:26 PM  For on call review [email or web address].

## 2023-02-26 NOTE — ED Notes (Signed)
Attempted to notify RN pt in room, no answer. Vet RN in room with pt

## 2023-02-26 NOTE — ED Triage Notes (Signed)
ARrives stating he is in DKA.  Vomiting started this morning.  20g IV in left hand.

## 2023-02-26 NOTE — Assessment & Plan Note (Signed)
 BP stable Titrate home regimen

## 2023-02-26 NOTE — Assessment & Plan Note (Signed)
+   generalized lethargy in setting of DKA  Nonfocal neuro exam  Toxic-metabolic etiology  Monitor mentation w/ treatment

## 2023-02-26 NOTE — Inpatient Diabetes Management (Signed)
Inpatient Diabetes Program Recommendations  AACE/ADA: New Consensus Statement on Inpatient Glycemic Control (2015)  Target Ranges:  Prepandial:   less than 140 mg/dL      Peak postprandial:   less than 180 mg/dL (1-2 hours)      Critically ill patients:  140 - 180 mg/dL   Lab Results  Component Value Date   GLUCAP 280 (H) 01/13/2023   HGBA1C 10.4 (H) 04/10/2022    Latest Reference Range & Units Most Recent  Sodium 135 - 145 mmol/L 130 (L) 02/26/23 10:27  Potassium 3.5 - 5.1 mmol/L 6.3 (HH) 02/26/23 10:27  Chloride 98 - 111 mmol/L 87 (L) 02/26/23 10:27  CO2 22 - 32 mmol/L 17 (L) 02/26/23 10:27  Glucose 70 - 99 mg/dL 4,098 (HH) 01/23/89 47:82  Mean Plasma Glucose mg/dL 956 03/07/06 65:78  BUN 6 - 20 mg/dL 57 (H) 04/29/94 29:52  Creatinine 0.61 - 1.24 mg/dL 8.41 (H) 03/25/42 01:02  Calcium 8.9 - 10.3 mg/dL 9.5 07/25/51 66:44  Anion gap 5 - 15  26 (H) 02/26/23 10:27  (HH): Data is critically high (L): Data is abnormally low (H): Data is abnormally high  Diabetes history: DM1 (dxd at age 43), CKD  Outpatient Diabetes medications: Tandem Insulin Pump (see rates below) Current orders for Inpatient glycemic control: IV insulin  Inpatient Diabetes Program Recommendations:   Patient currently in ED on IV insulin. Patient sees Dr. Lonzo Cloud for diabetes management and last visit was 06/22/22. Insulin pump settings listed below: " Pump   Tandem   Settings   Insulin type   Novolog     Basal rate          0000 0.700 u/h     0600 0.875    1700 0.790    2130 0.800                I:C ratio          0000 1:15                               Sensitivity          0000  50         Goal          0000  110 "        Will followup with patient regarding insulin pump not working well. According to significant other, patient is working night shift for [employer] and occasionally the insulin site does not work and unsure if pt. Is changing site immediately when this occurs.  Thank you, Billy Fischer.  Taeveon Keesling, RN, MSN, CDCES  Diabetes Coordinator Inpatient Glycemic Control Team Team Pager [phone] (8am-5pm) 02/26/2023 11:58 AM

## 2023-02-26 NOTE — ED Notes (Signed)
Insulin pump removed from LLQ. Needle placed in sharps container. Pump secured in biohazard bag and placed into pt's personal belongings bag.

## 2023-02-26 NOTE — Assessment & Plan Note (Signed)
Creatinine 2.37 with GFR in upper 20s Appears near baseline though clinically dry IV fluid hydration Monitor closely

## 2023-02-26 NOTE — ED Provider Notes (Signed)
[facility] Provider Note    Event Date/Time   First MD Initiated Contact with Patient 02/26/23 1113     (approximate)   History   Hyperglycemia   HPI  James [name] is a 43 y.o. male past medical tree significant for type 1 diabetes who presents to the emergency department with hyperglycemia.  States that he started to not feel well this morning.  Patient states that his insulin pump was not working.  It is in his lower abdomen.  Associated with nausea and not feeling well.  Denies any chest pain or shortness of breath.  No fever or chills.  States that he has been having nausea.     Physical Exam   Triage Vital Signs: ED Triage Vitals  Encounter Vitals Group     BP 02/26/23 1020 (!) 158/134     Systolic BP Percentile --      Diastolic BP Percentile --      Pulse Rate 02/26/23 1020 (!) 104     Resp 02/26/23 1020 20     Temp 02/26/23 1020 97.8 F (36.6 C)     Temp Source 02/26/23 1020 Axillary     SpO2 02/26/23 1020 100 %     Weight 02/26/23 1021 160 lb 0.9 oz (72.6 kg)     Height --      Head Circumference --      Peak Flow --      Pain Score 02/26/23 1021 0     Pain Loc --      Pain Education --      Exclude from Growth Chart --     Most recent vital signs: Vitals:   02/26/23 1020  BP: (!) 158/134  Pulse: (!) 104  Resp: 20  Temp: 97.8 F (36.6 C)  SpO2: 100%    Physical Exam Constitutional:      Appearance: He is well-developed. He is ill-appearing.  HENT:     Head: Atraumatic.     Mouth/Throat:     Mouth: Mucous membranes are dry.  Eyes:     Extraocular Movements: Extraocular movements intact.     Conjunctiva/sclera: Conjunctivae normal.     Pupils: Pupils are equal, round, and reactive to light.  Cardiovascular:     Rate and Rhythm: Regular rhythm. Tachycardia present.  Pulmonary:     Effort: No respiratory distress.     Breath sounds: No wheezing.  Abdominal:     Tenderness: There is no abdominal tenderness.   Musculoskeletal:     Cervical back: Normal range of motion.  Skin:    General: Skin is warm.     Capillary Refill: Capillary refill takes 2 to 3 seconds.  Neurological:     Mental Status: He is alert. Mental status is at baseline.  Psychiatric:        Mood and Affect: Mood normal.     IMPRESSION / MDM / ASSESSMENT AND PLAN / ED COURSE  I reviewed the triage vital signs and the nursing notes.  On arrival patient is afebrile, tachycardic and hypertensive.  Differential diagnosis including hyperglycemia, DKA, hyperosmolar syndrome, dehydration  EKG  I, Corena Herter, the attending physician, personally viewed and interpreted this ECG.   Rate: Normal  Rhythm: Normal sinus  Axis: Normal  Intervals: Normal  ST&T Change: None  Sinus tachycardia while on cardiac telemetry.   LABS (all labs ordered are listed, but only abnormal results are displayed) Labs interpreted as -    Labs Reviewed  BASIC  METABOLIC PANEL - Abnormal; Notable for the following components:      Result Value   Sodium 130 (*)    Potassium 6.3 (*)    Chloride 87 (*)    CO2 17 (*)    Glucose, Bld 1,032 (*)    BUN 57 (*)    Creatinine, Ser 2.37 (*)    GFR, Estimated 34 (*)    Anion gap 26 (*)    All other components within normal limits  CBC - Abnormal; Notable for the following components:   WBC 10.8 (*)    Hemoglobin 11.9 (*)    HCT 38.8 (*)    Platelets 146 (*)    All other components within normal limits  BLOOD GAS, VENOUS - Abnormal; Notable for the following components:   pH, Ven 7.22 (*)    pO2, Ven 60 (*)    Bicarbonate 18.4 (*)    Acid-base deficit 9.1 (*)    All other components within normal limits  URINALYSIS, ROUTINE W REFLEX MICROSCOPIC  BETA-HYDROXYBUTYRIC ACID  CBG MONITORING, ED  CBG MONITORING, ED  TROPONIN I (HIGH SENSITIVITY)     MDM    Patient meets criteria for DKA.  Patient's glucose level is in the 1000's.  He has got an acute kidney injury with a creatinine  elevated up to 2.3 with an elevated BUN of 57.  Elevated anion gap of 26.  Significant hyperkalemia at 6.3 but does note some hemolysis.  Hyponatremia in the setting of hyperglycemia.  Pending beta hydroxybutyrate.  Given 2 L of IV fluids.  Started on insulin infusion.  Consulted hospitalist for DKA likely in the setting of medication noncompliance/insulin pump malfunction   PROCEDURES:  Critical Care performed: yes  .Critical Care  Performed by: Corena Herter, MD Authorized by: Corena Herter, MD   Critical care provider statement:    Critical care time (minutes):  30   Critical care time was exclusive of:  Separately billable procedures and treating other patients   Critical care was necessary to treat or prevent imminent or life-threatening deterioration of the following conditions:  Endocrine crisis   Critical care was time spent personally by me on the following activities:  Development of treatment plan with patient or surrogate, discussions with consultants, evaluation of patient's response to treatment, examination of patient, ordering and review of laboratory studies, ordering and review of radiographic studies, ordering and performing treatments and interventions, pulse oximetry, re-evaluation of patient's condition and review of old charts   Care discussed with: admitting provider     Patient's presentation is most consistent with acute presentation with potential threat to life or bodily function.   MEDICATIONS ORDERED IN ED: Medications  insulin regular, human (MYXREDLIN) 100 units/ 100 mL infusion (has no administration in time range)  lactated ringers infusion (has no administration in time range)  dextrose 5 % in lactated ringers infusion (has no administration in time range)  dextrose 50 % solution 0-50 mL (has no administration in time range)  lactated ringers bolus 1,000 mL (has no administration in time range)  sodium chloride 0.9 % bolus 1,000 mL (1,000 mLs  Intravenous New Bag/Given 02/26/23 1026)  ondansetron (ZOFRAN) injection 4 mg (4 mg Intravenous Given 02/26/23 1024)    FINAL CLINICAL IMPRESSION(S) / ED DIAGNOSES   Final diagnoses:  Hyperglycemia  Diabetic ketoacidosis without coma associated with type 1 diabetes mellitus (HCC)     Rx / DC Orders   ED Discharge Orders     None  Note:  This document was prepared using Dragon voice recognition software and may include unintentional dictation errors.   Corena Herter, MD 02/26/23 1130

## 2023-02-26 NOTE — Assessment & Plan Note (Signed)
 Statin

## 2023-02-27 ENCOUNTER — Encounter: Payer: Self-pay | Admitting: Family Medicine

## 2023-02-27 DIAGNOSIS — E1010 Type 1 diabetes mellitus with ketoacidosis without coma: Secondary | ICD-10-CM | POA: Diagnosis not present

## 2023-02-27 LAB — BASIC METABOLIC PANEL
Anion gap: 11 (ref 5–15)
Anion gap: 12 (ref 5–15)
BUN: 45 mg/dL — ABNORMAL HIGH (ref 6–20)
BUN: 51 mg/dL — ABNORMAL HIGH (ref 6–20)
CO2: 29 mmol/L (ref 22–32)
CO2: 29 mmol/L (ref 22–32)
Calcium: 8.6 mg/dL — ABNORMAL LOW (ref 8.9–10.3)
Calcium: 9.1 mg/dL (ref 8.9–10.3)
Chloride: 105 mmol/L (ref 98–111)
Chloride: 106 mmol/L (ref 98–111)
Creatinine, Ser: 1.97 mg/dL — ABNORMAL HIGH (ref 0.61–1.24)
Creatinine, Ser: 2.02 mg/dL — ABNORMAL HIGH (ref 0.61–1.24)
GFR, Estimated: 41 mL/min — ABNORMAL LOW (ref >60)
GFR, Estimated: 42 mL/min — ABNORMAL LOW (ref >60)
Glucose, Bld: 229 mg/dL — ABNORMAL HIGH (ref 70–99)
Glucose, Bld: 244 mg/dL — ABNORMAL HIGH (ref 70–99)
Potassium: 3.7 mmol/L (ref 3.5–5.1)
Potassium: 4.1 mmol/L (ref 3.5–5.1)
Sodium: 146 mmol/L — ABNORMAL HIGH (ref 135–145)
Sodium: 146 mmol/L — ABNORMAL HIGH (ref 135–145)

## 2023-02-27 LAB — MAGNESIUM: Magnesium: 2.6 mg/dL — ABNORMAL HIGH (ref 1.7–2.4)

## 2023-02-27 LAB — CBG MONITORING, ED
Glucose-Capillary: 132 mg/dL — ABNORMAL HIGH (ref 70–99)
Glucose-Capillary: 148 mg/dL — ABNORMAL HIGH (ref 70–99)
Glucose-Capillary: 161 mg/dL — ABNORMAL HIGH (ref 70–99)
Glucose-Capillary: 187 mg/dL — ABNORMAL HIGH (ref 70–99)
Glucose-Capillary: 194 mg/dL — ABNORMAL HIGH (ref 70–99)
Glucose-Capillary: 195 mg/dL — ABNORMAL HIGH (ref 70–99)
Glucose-Capillary: 203 mg/dL — ABNORMAL HIGH (ref 70–99)
Glucose-Capillary: 203 mg/dL — ABNORMAL HIGH (ref 70–99)
Glucose-Capillary: 210 mg/dL — ABNORMAL HIGH (ref 70–99)
Glucose-Capillary: 219 mg/dL — ABNORMAL HIGH (ref 70–99)
Glucose-Capillary: 222 mg/dL — ABNORMAL HIGH (ref 70–99)
Glucose-Capillary: 243 mg/dL — ABNORMAL HIGH (ref 70–99)
Glucose-Capillary: 334 mg/dL — ABNORMAL HIGH (ref 70–99)

## 2023-02-27 LAB — VITAMIN D 25 HYDROXY (VIT D DEFICIENCY, FRACTURES): Vit D, 25-Hydroxy: 32.53 ng/mL (ref 30–100)

## 2023-02-27 LAB — PHOSPHORUS: Phosphorus: 4.2 mg/dL (ref 2.5–4.6)

## 2023-02-27 LAB — BETA-HYDROXYBUTYRIC ACID: Beta-Hydroxybutyric Acid: 0.37 mmol/L — ABNORMAL HIGH (ref 0.05–0.27)

## 2023-02-27 LAB — GLUCOSE, CAPILLARY: Glucose-Capillary: 95 mg/dL (ref 70–99)

## 2023-02-27 LAB — HIV ANTIBODY (ROUTINE TESTING W REFLEX): HIV Screen 4th Generation wRfx: NONREACTIVE

## 2023-02-27 MED ORDER — INSULIN ASPART 100 UNIT/ML IJ SOLN
4.0000 [IU] | Freq: Three times a day (TID) | INTRAMUSCULAR | Status: DC
  Administered 2023-02-27 – 2023-02-28 (×3): 4 [IU] via SUBCUTANEOUS
  Filled 2023-02-27 (×3): qty 1

## 2023-02-27 MED ORDER — INSULIN ASPART 100 UNIT/ML IJ SOLN
0.0000 [IU] | Freq: Three times a day (TID) | INTRAMUSCULAR | Status: DC
  Administered 2023-02-27: 3 [IU] via SUBCUTANEOUS
  Administered 2023-02-27: 11 [IU] via SUBCUTANEOUS
  Administered 2023-02-28: 3 [IU] via SUBCUTANEOUS
  Administered 2023-02-28: 5 [IU] via SUBCUTANEOUS
  Filled 2023-02-27 (×4): qty 1

## 2023-02-27 MED ORDER — INSULIN GLARGINE-YFGN 100 UNIT/ML ~~LOC~~ SOLN
20.0000 [IU] | SUBCUTANEOUS | Status: DC
  Administered 2023-02-27 – 2023-02-28 (×2): 20 [IU] via SUBCUTANEOUS
  Filled 2023-02-27 (×3): qty 0.2

## 2023-02-27 NOTE — ED Notes (Signed)
Transition orders requested from MD

## 2023-02-27 NOTE — ED Notes (Signed)
RN informed bed assigned 

## 2023-02-27 NOTE — Inpatient Diabetes Management (Signed)
Inpatient Diabetes Program Recommendations  AACE/ADA: New Consensus Statement on Inpatient Glycemic Control (2015)  Target Ranges:  Prepandial:   less than 140 mg/dL      Peak postprandial:   less than 180 mg/dL (1-2 hours)      Critically ill patients:  140 - 180 mg/dL   Lab Results  Component Value Date   GLUCAP 132 (H) 02/27/2023   HGBA1C 10.4 (H) 04/10/2022    Diabetes history: DM1 (dx'd at age 44), CKD  Outpatient Diabetes medications: Tandem Insulin Pump-Total basal =19.1 units/day Current orders for Inpatient glycemic control: Semglee 20 units daily, Novolog 4 units tid meal coverage, Novolog 0-15 units tid correction  Inpatient Diabetes Program Recommendations:   Transitioning to subcutaneous insulin-Semglee 20 units given. Unable to speak with [name] yesterday due to nausea and vomiting. Spoke with [name] this am to discuss insulin management. [name] CGM had come off prior to admission and he didn't have any other CGM and no insulin pump supplies to change insulin insertion sites. Reviewed with [name] need to have supplies available with him while working in case he should start getting increase in CBGs and not receiving insulin. [name] states he has insulin @ home but all other insulin pump supplies are needing to be picked up @ the pharmacy. Spoke with significant other James [name] again today by phone. James [name]. SO shared that sometimes [name] waits to the last minute to pick up needed supplies.  Thank you, James [name]. James Ivins, RN, MSN, CDCES  Diabetes Coordinator Inpatient Glycemic Control Team Team Pager [phone] (8am-5pm) 02/27/2023 11:09 AM

## 2023-02-27 NOTE — Progress Notes (Signed)
 [facility] Progress Note  Patient: James [name]    FMW:[id number]  DOA: 02/26/2023     Date of Service: the patient was seen and examined on 02/27/2023  Chief Complaint  Patient presents with   Hyperglycemia   Brief hospital course: James [name] is a 43 y.o. male with medical history significant of type 1 diabetes presenting with DKA and encephalopathy.  Limited history in setting of cephalopathy.  Per report, patient with malfunctioning insulin  pump.  Unclear duration.  Noted sugars up into the 1000's per patient.  No reported chest pain.  Positive nausea weakness and generalized malaise.  No reported abdominal pain or diarrhea.  No focal hemiparesis or confusion.  No reported alcohol or tobacco use. Presented to the ER afebrile, heart rate 100s, BP stable.  Satting well on room air.  White count 10.8, hemoglobin James [name], platelets 146, blood sugars greater than 600 on multiple draws.  Bicarb 17, potassium 6.3, creatinine 2.37, urinalysis with positive ketones. Started on DKA protocol.    Assessment and Plan: DKA (diabetic ketoacidosis) (HCC) Meeting DKA criteria w/ glu 1032, pH 7.22, + ketones  Secondary to glucose monitor malfunction  BHB 7.99---0.37, s/p DKA protocol  Transition to Semglee  20 units twice daily and NovoLog  sliding scale Diabetic education  Monitor CBG, continue diabetic diet      CKD (chronic kidney disease), stage III  Creatinine 2.37 with GFR in upper 20s Appears near baseline though clinically dry IV fluid hydration Monitor closely Creatinine 1.97, gradually improving   Acute encephalopathy + generalized lethargy in setting of DKA  Nonfocal neuro exam  Toxic-metabolic etiology  Monitor mentation w/ treatment   Hyperlipidemia Statin    Essential hypertension BP stable  Titrate home regimen    Body mass index is 20.55 kg/m.  Interventions:  Diet: Diabetic diet DVT Prophylaxis: Subcutaneous Lovenox    Advance goals of care discussion:  Full code  Family Communication: family was not present at bedside, at the time of interview.  The pt provided permission to discuss medical plan with the family. Opportunity was given to ask question and all questions were answered satisfactorily.   Disposition:  Pt is from Home, admitted with DKA, still has elevated blood glucose, which precludes a safe discharge. Discharge to Home, when stable, most likely tomorrow a.m.  Subjective: No significant events overnight, patient gradually getting better, denies any nausea vomiting, no abdominal pain, no any chest pain or palpitation, no shortness of breath.  Just feels weak and tired   Physical Exam: General: NAD, lying comfortably Appear in no distress, affect appropriate Eyes: PERRLA ENT: Oral Mucosa Clear, moist  Neck: no JVD,  Cardiovascular: S1 and S2 Present, no Murmur,  Respiratory: good respiratory effort, Bilateral Air entry equal and Decreased, no Crackles, no wheezes Abdomen: Bowel Sound present, Soft and no tenderness,  Skin: no rashes Extremities: no Pedal edema, no calf tenderness Neurologic: without any new focal findings Gait not checked due to patient safety concerns  Vitals:   02/27/23 1146 02/27/23 1200 02/27/23 1230 02/27/23 1600  BP:  (!) 159/89 (!) 142/87 128/71  Pulse:  88 86 88  Resp:  15 13 12   Temp: 99.1 F (37.3 C)     TempSrc: Oral     SpO2:  95% 96% 96%  Weight:        Intake/Output Summary (Last 24 hours) at 02/27/2023 1626 Last data filed at 02/26/2023 1857 Gross per 24 hour  Intake --  Output 700 ml  Net -700 ml  Filed Weights   02/26/23 1021  Weight: 72.6 kg    Data Reviewed: I have personally reviewed and interpreted daily labs, tele strips, imagings as discussed above. I reviewed all nursing notes, pharmacy notes, vitals, pertinent old records I have discussed plan of care as described above with RN and patient/family.  CBC: Recent Labs  Lab 02/26/23 1027  WBC 10.8*  HGB 11.9*   HCT 38.8*  MCV 87.0  PLT 146*   Basic Metabolic Panel: Recent Labs  Lab 02/26/23 1300 02/26/23 1549 02/26/23 2001 02/27/23 0003 02/27/23 0401  NA 135 143 145 146* 146*  K 4.6 4.7 4.4 3.7 4.1  CL 92* 100 106 105 106  CO2 18* 24 26 29 29   GLUCOSE 850* 528* 273* 229* 244*  BUN 57* 53* 49* 51* 45*  CREATININE 2.36* 2.30* 2.00* 2.02* 1.97*  CALCIUM  9.2 9.7 8.7* 9.1 8.6*  MG 2.7*  --   --   --  2.6*  PHOS 5.2*  --   --   --  4.2    Studies: No results found.  Scheduled Meds:  enoxaparin  (LOVENOX ) injection  40 mg Subcutaneous Q24H   insulin  aspart  0-15 Units Subcutaneous TID WC   insulin  aspart  4 Units Subcutaneous TID WC   insulin  glargine-yfgn  20 Units Subcutaneous Q24H   Continuous Infusions:  lactated ringers      PRN Meds: dextrose   Time spent: 55 minutes  Author: ELVAN [name]. MD [facility] 02/27/2023 4:26 PM  To reach On-call, see care teams to locate the attending and reach out to them via [email or web address]. If 7PM-7AM, please contact night-coverage If you still have difficulty reaching the attending provider, please page the [facility] (Director on Call) for [facility] on amion for assistance.

## 2023-02-27 NOTE — ED Notes (Signed)
 Pt given ice chips

## 2023-02-28 DIAGNOSIS — E1010 Type 1 diabetes mellitus with ketoacidosis without coma: Secondary | ICD-10-CM | POA: Diagnosis not present

## 2023-02-28 LAB — BASIC METABOLIC PANEL
Anion gap: 9 (ref 5–15)
BUN: 28 mg/dL — ABNORMAL HIGH (ref 6–20)
CO2: 29 mmol/L (ref 22–32)
Calcium: 8.9 mg/dL (ref 8.9–10.3)
Chloride: 103 mmol/L (ref 98–111)
Creatinine, Ser: 1.33 mg/dL — ABNORMAL HIGH (ref 0.61–1.24)
GFR, Estimated: >60 mL/min (ref >60)
Glucose, Bld: 149 mg/dL — ABNORMAL HIGH (ref 70–99)
Potassium: 4.2 mmol/L (ref 3.5–5.1)
Sodium: 141 mmol/L (ref 135–145)

## 2023-02-28 LAB — CBC
HCT: 35.2 % — ABNORMAL LOW (ref 39.0–52.0)
Hemoglobin: 11.1 g/dL — ABNORMAL LOW (ref 13.0–17.0)
MCH: 26.5 pg (ref 26.0–34.0)
MCHC: 31.5 g/dL (ref 30.0–36.0)
MCV: 84 fL (ref 80.0–100.0)
Platelets: 141 10*3/uL — ABNORMAL LOW (ref 150–400)
RBC: 4.19 MIL/uL — ABNORMAL LOW (ref 4.22–5.81)
RDW: 14 % (ref 11.5–15.5)
WBC: 11.2 10*3/uL — ABNORMAL HIGH (ref 4.0–10.5)
nRBC: 0 % (ref 0.0–0.2)

## 2023-02-28 LAB — GLUCOSE, CAPILLARY
Glucose-Capillary: 206 mg/dL — ABNORMAL HIGH (ref 70–99)
Glucose-Capillary: 247 mg/dL — ABNORMAL HIGH (ref 70–99)
Glucose-Capillary: 197 mg/dL — ABNORMAL HIGH (ref 70–99)

## 2023-02-28 LAB — HEMOGLOBIN A1C
Hgb A1c MFr Bld: 14 % — ABNORMAL HIGH (ref 4.8–5.6)
Mean Plasma Glucose: 355 mg/dL

## 2023-02-28 LAB — MAGNESIUM: Magnesium: 2.3 mg/dL (ref 1.7–2.4)

## 2023-02-28 LAB — PHOSPHORUS: Phosphorus: 3.5 mg/dL (ref 2.5–4.6)

## 2023-02-28 MED ORDER — LOSARTAN POTASSIUM 25 MG PO TABS: 25.0000 mg | ORAL_TABLET | Freq: Every day | ORAL | 3 refills | Status: DC

## 2023-02-28 NOTE — Discharge Summary (Signed)
 [facility] Discharge Summary   Patient: James [name] FMW:[id number]  PCP: Charlott Dorn LABOR, MD  Date of admission: 02/26/2023   Date of discharge:  02/28/2023     Discharge Diagnoses:  Principal Problem:   DKA (diabetic ketoacidosis) (HCC) Active Problems:   Essential hypertension   Hyperlipidemia   Acute encephalopathy   CKD (chronic kidney disease), stage III (HCC)   Admitted From: Home Disposition:  Home   Recommendations for Outpatient Follow-up:  Follow-up with PCP in 1 week, monitor CBG at home, continue diabetic diet. Monitor BP at home, continued losartan  home dose. Follow-up with endocrine if needed due to uncontrolled diabetes. Follow up LABS/TEST:  As above   Diet recommendation: Cardiac and Carb modified diet  Activity: The patient is advised to gradually reintroduce usual activities, as tolerated  Discharge Condition: stable  Code Status: Full code   History of present illness: As per the H and P dictated on admission Hospital Course:  Pritesh Sobecki is a 43 y.o. male with medical history significant of type 1 diabetes presenting with DKA and encephalopathy.  Limited history in setting of cephalopathy.  Per report, patient with malfunctioning insulin  pump.  Unclear duration.  Noted sugars up into the 1000's per patient.  No reported chest pain.  Positive nausea weakness and generalized malaise.  No reported abdominal pain or diarrhea.  No focal hemiparesis or confusion.  No reported alcohol or tobacco use. Presented to the ER afebrile, heart rate 100s, BP stable.  Satting well on room air.  White count 10.8, hemoglobin Levan 0.9, platelets 146, blood sugars greater than 600 on multiple draws.  Bicarb 17, potassium 6.3, creatinine 2.37, urinalysis with positive ketones. Started on DKA protocol.      Assessment and Plan: # DKA (diabetic ketoacidosis)  Meeting DKA criteria w/ glu 1032, pH 7.22, + ketones  Possible due to glucose monitor malfunction  BHB  7.99---0.37, s/p DKA protocol  Transition to Semglee  20 units twice daily and NovoLog  sliding scale Diabetic education done.  Resumed home regimen, patient was advised to monitor CBG at home and follow with PCP for further management.  Continue diabetic diet.  Counseling done.     # AKI, Creatinine 2.37 on admission, due to dehydration secondary to DKA S/p IV fluid given for hydration.  Creatinine 1.33, improved.  Patient was advised to continue oral hydration.  AKI resolved. eGFR >60 ckd 2   # Acute metabolic encephalopathy and generalized lethargy in setting of DKA. S/p DKA management as above.  Mental status improved and patient is back to his baseline.  # Hyperlipidemia, continue statin  # Essential hypertension: Continue losartan  25 mg p.o. daily home dose.  Patient was advised to monitor BP at home and follow with PCP to titrate medication accordingly.   Body mass index is 20.55 kg/m.  Nutrition Interventions:   Patient was ambulatory without any assistance. On the day of the discharge the patient's vitals were stable, and no other acute medical condition were reported by patient. the patient was felt safe to be discharge at Home.  Consultants: Diabetic auditor Procedures: None  Discharge Exam: General: Appear in no distress, no Rash; Oral Mucosa Clear, moist. Cardiovascular: S1 and S2 Present, no Murmur, Respiratory: normal respiratory effort, Bilateral Air entry present and no Crackles, no wheezes Abdomen: Bowel Sound present, Soft and no tenderness, no hernia Extremities: no Pedal edema, no calf tenderness Neurology: alert and oriented to time, place, and person affect appropriate.  Filed Weights   02/26/23 1021  Weight: 72.6 kg   Vitals:   02/28/23 0607 02/28/23 0814  BP: (!) 151/84 (!) 160/95  Pulse: 94 91  Resp: 19 16  Temp: 98.7 F (37.1 C) 99 F (37.2 C)  SpO2: 97% 99%    DISCHARGE MEDICATION: Allergies as of 02/28/2023   No Known Allergies       Medication List     TAKE these medications    acetaminophen  325 MG tablet Commonly known as: TYLENOL  Take 2 tablets (650 mg total) by mouth every 6 (six) hours as needed for mild pain (or Fever >/= 101).   cholecalciferol  25 MCG (1000 UNIT) tablet Commonly known as: VITAMIN D3 Take 1,000 Units by mouth daily.   Dexcom G6 Transmitter Misc Change every 90 days   ferrous gluconate  324 MG tablet Commonly known as: FERGON Take 324 mg by mouth. Take 1 tablet by mouth 3 (three) times a week   insulin  aspart 100 UNIT/ML injection Commonly known as: novoLOG  Max daily 30 units  per pump   losartan  25 MG tablet Commonly known as: COZAAR  Take 1 tablet (25 mg total) by mouth daily.   pantoprazole  40 MG tablet Commonly known as: PROTONIX  Take 40 mg by mouth daily.   rosuvastatin  20 MG tablet Commonly known as: CRESTOR  Take 20 mg by mouth at bedtime.       No Known Allergies Discharge Instructions     Call MD for:  difficulty breathing, headache or visual disturbances   Complete by: As directed    Call MD for:  extreme fatigue   Complete by: As directed    Call MD for:  persistant dizziness or light-headedness   Complete by: As directed    Call MD for:  persistant nausea and vomiting   Complete by: As directed    Call MD for:  severe uncontrolled pain   Complete by: As directed    Call MD for:  temperature >100.4   Complete by: As directed    Diet - low sodium heart healthy   Complete by: As directed    Discharge instructions   Complete by: As directed    Follow-up with PCP in 1 week, monitor CBG at home, continue diabetic diet. Monitor BP at home, continued losartan  home dose. Follow-up with endocrine if needed due to uncontrolled diabetes.   Increase activity slowly   Complete by: As directed        The results of significant diagnostics from this hospitalization (including imaging, microbiology, ancillary and laboratory) are listed below for reference.     Significant Diagnostic Studies: No results found.  Microbiology: No results found for this or any previous visit (from the past 240 hours).   Labs: CBC: Recent Labs  Lab 02/26/23 1027 02/28/23 0418  WBC 10.8* 11.2*  HGB 11.9* 11.1*  HCT 38.8* 35.2*  MCV 87.0 84.0  PLT 146* 141*   Basic Metabolic Panel: Recent Labs  Lab 02/26/23 1300 02/26/23 1549 02/26/23 2001 02/27/23 0003 02/27/23 0401 02/28/23 0418  NA 135 143 145 146* 146* 141  K 4.6 4.7 4.4 3.7 4.1 4.2  CL 92* 100 106 105 106 103  CO2 18* 24 26 29 29 29   GLUCOSE 850* 528* 273* 229* 244* 149*  BUN 57* 53* 49* 51* 45* 28*  CREATININE 2.36* 2.30* 2.00* 2.02* 1.97* 1.33*  CALCIUM  9.2 9.7 8.7* 9.1 8.6* 8.9  MG 2.7*  --   --   --  2.6* 2.3  PHOS 5.2*  --   --   --  4.2 3.5   Liver Function Tests: No results for input(s): AST, ALT, ALKPHOS, BILITOT, PROT, ALBUMIN in the last 168 hours. No results for input(s): LIPASE, AMYLASE in the last 168 hours. No results for input(s): AMMONIA in the last 168 hours. Cardiac Enzymes: No results for input(s): CKTOTAL, CKMB, CKMBINDEX, TROPONINI in the last 168 hours. BNP (last 3 results) No results for input(s): BNP in the last 8760 hours. CBG: Recent Labs  Lab 02/27/23 1125 02/27/23 1625 02/27/23 2109 02/28/23 0611 02/28/23 0818  GLUCAP 161* 334* 95 206* 247*    Time spent: 35 minutes  Signed:  Elvan Sor  [facility] 02/28/2023 10:58 AM

## 2023-02-28 NOTE — Inpatient Diabetes Management (Addendum)
 Inpatient Diabetes Program Recommendations  AACE/ADA: New Consensus Statement on Inpatient Glycemic Control (2015)  Target Ranges:  Prepandial:   less than 140 mg/dL      Peak postprandial:   less than 180 mg/dL (1-2 hours)      Critically ill patients:  140 - 180 mg/dL    Latest Reference Range & Units 04/10/22 18:34 02/26/23 10:27  Hemoglobin A1C 4.8 - 5.6 % 10.4 (H) 14.0 (H)  355 mg/dl    Latest Reference Range & Units 02/27/23 06:17 02/27/23 07:21 02/27/23 08:29 02/27/23 09:27 02/27/23 10:27 02/27/23 11:25 02/27/23 16:25 02/27/23 21:09  Glucose-Capillary 70 - 99 mg/dL 812 (H)  IV Insulin  Drip 203 (H) 219 (H) 194 (H)  20 units Semglee  @0913  132 (H) 161 (H)  3 units Novolog   334 (H)  15 units Novolog   95  (H): Data is abnormally high  Latest Reference Range & Units 02/28/23 08:18  Glucose-Capillary 70 - 99 mg/dL 752 (H)  9 units Novolog   20 units Semglee   (H): Data is abnormally high    Admit with: DKA  History: Type 1 Diabetes  Home DM Meds: Tandem Insulin  Pump  Current Orders: Semglee  20 units Daily      Novolog  Moderate Correction Scale/ SSI (0-15 units) TID AC       Novolog  4 units TID with meals   MD- Please consider:  1. Increase Semglee  slightly to 22 units Daily Please give an extra 2 units Semglee  X 1 dose today  2. Increase Novolog  Meal Coverage to 6 units TID with meals    Addendum 11am--Met w/ pt at bedside.  He was sleepy but opened his eyes and did answer all my questions.  Pt appeared to have flat affect.  I asked pt if he has all his pump supplies and pt stated Yes and that his wife got his sensor supplies as well.  I reviewed his A1c with him and reminded him that he needs better control at home to prevent complications.  Pt stated he thinks he has appt with ENDO next month--I checked his AVS and he does have appt with ENDO 03/28/2023.  Reminded pt that he was given 20 units Semglee  insulin  this AM and that he should not resume the basal  rates on his pump until 7-8am tomorrow AM (02/06).  Pt stated he can bolus self for food and CBGs until tomorrow AM.  Plans to restart Dexcom sensor today.   Also reminded pt to always change set/site of pump when he has high CBGs at home that don't respond to insulin  boluses.  Pt gave me permission to call his SO Lannie Riding which I did.  Reviewed all of the above info with SO and relayed my concerns that pt not using his pump to the best of his ability at home.  SO told me she is also concerned about his self care and plans to get more involved with his medical care if needed.  SO very appreciative of call and all questions were answered.       ENDO: Dr. Sam with [facility] Last Seen in office 06/22/2022 Pump and meter download: Pump   Tandem   Settings   Insulin  type   Novolog      Basal rate          0000 0.700 u/h     0600 0.875    1700 0.790    2130 0.800  I:C ratio          0000 1:15       1:1 Enter #4 g TIDQAC                        Sensitivity          0000  50         Goal          0000  110            --Will follow patient during hospitalization--  Adina Rudolpho Arrow RN, MSN, CDCES Diabetes Coordinator Inpatient Glycemic Control Team Team Pager: [phone] (8a-5p)

## 2023-03-06 NOTE — Progress Notes (Shared)
[facility] - Clinic Note  03/14/2023     CHIEF COMPLAINT Patient presents for No chief complaint on file.   HISTORY OF PRESENT ILLNESS: James [name] is a 43 y.o. male who presents to the clinic today for:    Pt states vision is improving, he is not seeing any floaters in the right eye  Referring physician: Emilio Aspen, MD [address] [place],  [place] [address]  HISTORICAL INFORMATION:   Selected notes from the MEDICAL RECORD NUMBER Referred by Dr. Santiago Bumpers for vitreous hemorrhage LEE:  Ocular Hx- PMH-    CURRENT MEDICATIONS: No current outpatient medications on file. (Ophthalmic Drugs)   No current facility-administered medications for this visit. (Ophthalmic Drugs)   Current Outpatient Medications (Other)  Medication Sig   acetaminophen (TYLENOL) 325 MG tablet Take 2 tablets (650 mg total) by mouth every 6 (six) hours as needed for mild pain (or Fever >/= 101).   cholecalciferol (VITAMIN D3) 25 MCG (1000 UNIT) tablet Take 1,000 Units by mouth daily.   Continuous Glucose Transmitter (DEXCOM G6 TRANSMITTER) MISC Change every 90 days   ferrous gluconate (FERGON) 324 MG tablet Take 324 mg by mouth. Take 1 tablet by mouth 3 (three) times a week   insulin aspart (NOVOLOG) 100 UNIT/ML injection Max daily 30 units  per pump   losartan (COZAAR) 25 MG tablet Take 1 tablet (25 mg total) by mouth daily.   pantoprazole (PROTONIX) 40 MG tablet Take 40 mg by mouth daily.   rosuvastatin (CRESTOR) 20 MG tablet Take 20 mg by mouth at bedtime.   No current facility-administered medications for this visit. (Other)   REVIEW OF SYSTEMS:   ALLERGIES No Known Allergies  PAST MEDICAL HISTORY Past Medical History:  Diagnosis Date   Diabetes mellitus without complication [facility])    Past Surgical History:  Procedure Laterality Date   AMPUTATION TOE Left 03/13/2021   Procedure: AMPUTATION BIG TOE;  Surgeon: Candelaria Stagers, DPM;  Location:  [facility] ORS;  Service: Podiatry;  Laterality: Left;   COCHLEAR IMPLANT  05/27/2020   [facility]   FAMILY HISTORY Family History  Problem Relation Age of Onset   Diabetes Father    SOCIAL HISTORY Social History   Tobacco Use   Smoking status: Never   Smokeless tobacco: Never  Vaping Use   Vaping status: Never Used  Substance Use Topics   Alcohol use: Never   Drug use: Never       OPHTHALMIC EXAM:  Not recorded    IMAGING AND PROCEDURES  Imaging and Procedures for 03/14/2023          ASSESSMENT/PLAN:    ICD-10-CM   1. 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 Vitreous hemorrhage of right eye (HCC)  H43.11     3. Essential hypertension  I10     4. Hypertensive retinopathy of both eyes  H35.033       1,2. Proliferative diabetic retinopathy w/o DME, OU (OD > OS) + diabetic VH OD  - pt previously followed by retina specialist in [place], [place] -- moved to [place] area recently  - history of PRP OU  - pt presented 9.28.23 with 2 wk history of decreased vision OD -- saw Dr. Santiago Bumpers who then referred here. - FA (09.28.23) shows OD: Focal blockage along distal IT arcades corresponding to pre-retinal heme, scattered patches of NVE 360, mild NVD; OS:  Focal patch of MA, vascular non-perfusion and NVE temporal macula -- pt would likely benefit from some fill in PRP  - s/p IVA OU #1 (10.03.23), #2 (10.31.23), #3 (11.28.23)  - s/p fill in PRP OS (10.17.23) - exam shows mild diffuse VH OD -- improving and clear enough for fill in PRP OD; good posterior PRP fill in OS - BCVA OD stable at 20/25 OD, 20/20 OS - OCT shows OD: PRF inferior and temporal midzone -- slightly improved, mild interval improvement in vit opacities inferiorly - discussed findings and prognosis - recommend IVA OU #4 today, 02.19.25 - pt wishes to proceed with laser - RBA of procedure discussed, questions answered - informed  consent obtained and signed - see procedure note - VH precautions reviewed -- minimize activities, keep head elevated, avoid ASA/NSAIDs/blood thinners as able - f/u next Weds at 2:45, DFE, OCT, possible PRP fill in OD - f/u 4-5 weeks for injection(s) (~December 26)  3,4. Hypertensive retinopathy OU - discussed importance of tight BP control - monitor  Ophthalmic Meds Ordered this visit:  No orders of the defined types were placed in this encounter.    No follow-ups on file.  There are no Patient Instructions on file for this visit.   Explained the diagnoses, plan, and follow up with the patient and they expressed understanding.  Patient expressed understanding of the importance of proper follow up care.   This document serves as a record of services personally performed by Karie Chimera, MD, PhD. It was created on their behalf by Glee Arvin. Manson Passey, OA an ophthalmic technician. The creation of this record is the provider's dictation and/or activities during the visit.    Electronically signed by: Glee Arvin. Manson Passey, OA 03/06/23 11:54 AM   Karie Chimera, M.D., Ph.D. Diseases & Surgery of the Retina and Vitreous [facility]     Abbreviations: M myopia (nearsighted); A astigmatism; H hyperopia (farsighted); P presbyopia; Mrx spectacle prescription;  CTL contact lenses; OD right eye; OS left eye; OU both eyes  XT exotropia; ET esotropia; PEK punctate epithelial keratitis; PEE punctate epithelial erosions; DES dry eye syndrome; MGD meibomian gland dysfunction; ATs artificial tears; PFAT's preservative free artificial tears; NSC nuclear sclerotic cataract; PSC posterior subcapsular cataract; ERM epi-retinal membrane; PVD posterior vitreous detachment; RD retinal detachment; DM diabetes mellitus; DR diabetic retinopathy; NPDR non-proliferative diabetic retinopathy; PDR proliferative diabetic retinopathy; CSME clinically significant macular edema; DME diabetic macular  edema; dbh dot blot hemorrhages; CWS cotton wool spot; POAG primary open angle glaucoma; C/D cup-to-disc ratio; HVF humphrey visual field; GVF goldmann visual field; OCT optical coherence tomography; IOP intraocular pressure; BRVO Branch retinal vein occlusion; CRVO central retinal vein occlusion; CRAO central retinal artery occlusion; BRAO branch retinal artery occlusion; RT retinal tear; SB scleral buckle; PPV pars plana vitrectomy; VH Vitreous hemorrhage; PRP panretinal laser photocoagulation; IVK intravitreal kenalog; VMT vitreomacular traction; MH Macular hole;  NVD neovascularization of the disc; NVE neovascularization elsewhere; AREDS age related eye disease study; ARMD age related macular degeneration; POAG primary open angle glaucoma; EBMD epithelial/anterior basement membrane dystrophy; ACIOL anterior chamber intraocular lens; IOL intraocular lens; PCIOL posterior chamber intraocular lens; Phaco/IOL phacoemulsification with intraocular lens placement; PRK photorefractive keratectomy; LASIK laser assisted in situ keratomileusis; HTN hypertension; DM diabetes mellitus; COPD chronic obstructive pulmonary disease

## 2023-03-14 ENCOUNTER — Encounter (INDEPENDENT_AMBULATORY_CARE_PROVIDER_SITE_OTHER): Payer: 59 | Admitting: Ophthalmology

## 2023-03-14 ENCOUNTER — Telehealth: Payer: Self-pay | Admitting: Pharmacist

## 2023-03-14 DIAGNOSIS — H35033 Hypertensive retinopathy, bilateral: Secondary | ICD-10-CM

## 2023-03-14 DIAGNOSIS — H4311 Vitreous hemorrhage, right eye: Secondary | ICD-10-CM

## 2023-03-14 DIAGNOSIS — I10 Essential (primary) hypertension: Secondary | ICD-10-CM

## 2023-03-14 DIAGNOSIS — E113593 Type 2 diabetes mellitus with proliferative diabetic retinopathy without macular edema, bilateral: Secondary | ICD-10-CM

## 2023-03-14 NOTE — Progress Notes (Signed)
   03/14/2023  Patient ID: James [name], male   DOB: 1980-08-13, 43 y.o.   MRN: [id number]  Received referral from hospital as [facility] patient who needs assistance with frequent hospitalizations (5 in the past year) and T1DM diabetes. Recent A1c of 14.0% on 02/26/23. Patient would fall into the TNM DM as well. Hospital stay for DKA and encephalopathy on 2/3-2/5.   Last met with Dr. Lonzo Cloud on 06/22/22 at [facility]. Next appointment scheduled for 03/28/23.   Medication adherence: Dexcom G6- 03/01/23 30DS Transmitter- 02/15/23 90DS Losartan 25mg , Pantoprazole 40mg , Rosuvastatin 40mg - 02/20/23 30DS Iron- 01/18/24 90DS Novolog for pump- 01/10/24 22mL=3000 units; how many daily? [facility] 1000IU- confirms taking  [facility]. Insurance is Commercial + Medicaid   **Summary for PCP:**  - Completed medication review. Reports taking everything as prescribed currently, but is almost out of the 02/20/23 medications which is expected - Reports having his insulin pump and insulin available at this time. Said BG readings have been in the 200's since discharge. Per Dexcom while on the phone, said it was 225 currently. - Reports adherence to medications at this time and does not need adherence packaging to assist. Advised to discuss with Dr. Orson Aloe if he changes his mind - Confirmed he is aware of Endo appointment scheduled for 03/28/23 and can attend. Emphasized importance of attending  *Main concern at this time is that he has not picked up Novolog from [facility] since 01/10/24 at 3000 units. If he was keeping his BG under control, last Endo appointment said he averaged 44 units per day= 68 days total. Should be running out of insulin soon if he was using pump as instructed (however was 10.4% at this time as well)  Follow-up task set for 04/18/23 to see how visit went and check-in on patient.   Marlowe Aschoff, PharmD [facility] Phone Number:  [phone]

## 2023-03-24 DIAGNOSIS — Z419 Encounter for procedure for purposes other than remedying health state, unspecified: Secondary | ICD-10-CM | POA: Diagnosis not present

## 2023-03-28 ENCOUNTER — Encounter: Payer: Self-pay | Admitting: Internal Medicine

## 2023-03-28 ENCOUNTER — Ambulatory Visit: Payer: 59 | Admitting: Internal Medicine

## 2023-03-28 VITALS — BP 122/80 | HR 98 | Ht 74.0 in | Wt 170.0 lb

## 2023-03-28 DIAGNOSIS — E1042 Type 1 diabetes mellitus with diabetic polyneuropathy: Secondary | ICD-10-CM | POA: Diagnosis not present

## 2023-03-28 DIAGNOSIS — E103593 Type 1 diabetes mellitus with proliferative diabetic retinopathy without macular edema, bilateral: Secondary | ICD-10-CM

## 2023-03-28 DIAGNOSIS — E785 Hyperlipidemia, unspecified: Secondary | ICD-10-CM

## 2023-03-28 DIAGNOSIS — E1065 Type 1 diabetes mellitus with hyperglycemia: Secondary | ICD-10-CM

## 2023-03-28 DIAGNOSIS — E1022 Type 1 diabetes mellitus with diabetic chronic kidney disease: Secondary | ICD-10-CM | POA: Diagnosis not present

## 2023-03-28 DIAGNOSIS — N1831 Chronic kidney disease, stage 3a: Secondary | ICD-10-CM

## 2023-03-28 MED ORDER — INSULIN ASPART 100 UNIT/ML IJ SOLN: INTRAMUSCULAR | 3 refills | Status: AC

## 2023-03-28 MED ORDER — DEXCOM G7 SENSOR MISC: 1.0000 | 3 refills | Status: AC

## 2023-03-28 MED ORDER — ROSUVASTATIN CALCIUM 20 MG PO TABS: 20.0000 mg | ORAL_TABLET | Freq: Every day | ORAL | 3 refills | Status: DC

## 2023-03-28 MED ORDER — LOSARTAN POTASSIUM 25 MG PO TABS: 25.0000 mg | ORAL_TABLET | Freq: Every day | ORAL | 3 refills | Status: AC

## 2023-03-28 NOTE — Progress Notes (Signed)
 Name: James [name]  MRN/ DOB: [id number], Sep 15, 1980   Age/ Sex: 43 y.o., male    PCP: Emilio Aspen, MD   Reason for Endocrinology Evaluation: Type 1 Diabetes Mellitus     Date of Initial Endocrinology Visit: 12/21/2021    PATIENT IDENTIFIER: James [name] is a 43 y.o. male with a past medical history of T1DM. The patient presented for initial endocrinology clinic visit on 12/21/2021 for consultative assistance with his diabetes management.    HPI: Mr. Bastidas was    Diagnosed with DM at age 32 Prior Medications tried/Intolerance: has been on the pump for years  Hemoglobin A1c has ranged from 8.3% in 2022, peaking at 9.2% in 2023.    SUBJECTIVE:   During the last visit (06/22/2022): 10.4%  Today (03/28/23): James [name] is here for follow-up on diabetes management.  He has not been to our clinic in 10 months.  He checks his  blood sugars multiple  times daily. The patient has not had hypoglycemic episodes since the last clinic visit. The patient is  symptomatic with these episodes.  The patient has had multiple ED visits for hyperglycemia/DKA  with the latest one on 02/26/2023  No nausea or vomiting since discharge  No constipation but has noted loose stools occasionally  Patient endorses left second toe injury last week, no fever, no discharge, no erythema he has been using topical antibiotics, patient follows with podiatry Dr. Allena Katz  This patient with type 1 diabetes is treated with Tandem  (insulin pump). During the visit the pump basal and bolus doses were reviewed including carb/insulin rations and supplemental doses. The clinical list was updated. The glucose meter download was reviewed in detail to determine if the current pump settings are providing the best glycemic control without excessive hypoglycemia.    Pump and meter download:    Pump   Tandem   Settings   Insulin type   Novolog    Basal rate       0000 0.700u/h    0600 0.875   1700 0.790    2130 0.800          I:C ratio       0000 1:1                  Sensitivity       0000  50      Goal       0000  110           Type & Model of Pump: Tandem  Insulin Type: Currently using Novolog .  Body mass index is 21.83 kg/m.  PUMP STATISTICS: Average BG: 269 Average Daily Carbs (g): 0 Average Total Daily Insulin: 52.12 Average Daily Basal: 26.41(51 %) Average Daily Bolus: 25.70 (49 %)    HOME DIABETES REGIMEN: Novolog  Losartan 25 mg daily    Statin: yes ACE-I/ARB: no   CONTINUOUS GLUCOSE MONITORING RECORD INTERPRETATION    Dates of Recording: 2/20-03/28/2023  Sensor description:dexcom G6  Results statistics:   CGM use % of time 90  Average and SD 269/111  Time in range  31 %  % Time Above 180 18  % Time Above 250 50  % Time Below target 0.4   Glycemic patterns summary: BG's trend down overnight and remained elevated throughout the day  Hyperglycemic episodes postprandial  Hypoglycemic episodes occurred N/A  Overnight periods: Trends down to optimal   DIABETIC COMPLICATIONS: Microvascular complications:  S/P left great toe amputation, retinopathy B/L ( he  is on injection)  Denies: neuropathy Last eye exam: Completed 11/28/ 2023  Macrovascular complications:   Denies: CAD, PVD, CVA   PAST HISTORY: Past Medical History:  Past Medical History:  Diagnosis Date   Diabetes mellitus without complication (HCC)    Past Surgical History:  Past Surgical History:  Procedure Laterality Date   AMPUTATION TOE Left 03/13/2021   Procedure: AMPUTATION BIG TOE;  Surgeon: Candelaria Stagers, DPM;  Location: [facility] ORS;  Service: Podiatry;  Laterality: Left;   COCHLEAR IMPLANT  05/27/2020   [facility]    Social History:  reports that he has never smoked. He has never used smokeless tobacco. He reports that he does not drink alcohol and does not use drugs. Family History:  Family History  Problem Relation Age of Onset   Diabetes Father      HOME  MEDICATIONS: Allergies as of 03/28/2023   No Known Allergies      Medication List        Accurate as of March 28, 2023  9:07 AM. If you have any questions, ask your nurse or doctor.          acetaminophen 325 MG tablet Commonly known as: TYLENOL Take 2 tablets (650 mg total) by mouth every 6 (six) hours as needed for mild pain (or Fever >/= 101).   atorvastatin 80 MG tablet Commonly known as: LIPITOR Take 80 mg by mouth daily.   cholecalciferol 25 MCG (1000 UNIT) tablet Commonly known as: VITAMIN D3 Take 1,000 Units by mouth daily.   Dexcom G6 Sensor Misc SMARTSIG:1 device SUB-Q   Dexcom G6 Transmitter Misc Change every 90 days   ferrous gluconate 324 MG tablet Commonly known as: FERGON Take 324 mg by mouth. Take 1 tablet by mouth 3 (three) times a week   insulin aspart 100 UNIT/ML injection Commonly known as: novoLOG Max daily 30 units  per pump   losartan 25 MG tablet Commonly known as: COZAAR Take 1 tablet (25 mg total) by mouth daily.   pantoprazole 40 MG tablet Commonly known as: PROTONIX Take 40 mg by mouth daily.   rosuvastatin 20 MG tablet Commonly known as: CRESTOR Take 20 mg by mouth at bedtime.         ALLERGIES: No Known Allergies   REVIEW OF SYSTEMS: A comprehensive ROS was conducted with the patient and is negative except as per HPI     OBJECTIVE:   VITAL SIGNS: BP 122/80 (BP Location: Left Arm, Patient Position: Sitting, Cuff Size: Small)   Pulse 98   Ht 6\' 2"  (1.88 m)   Wt 170 lb (77.1 kg)   SpO2 99%   BMI 21.83 kg/m    PHYSICAL EXAM:  General: Pt appears well and is in NAD  Lungs: Clear with good BS bilat   Heart: RRR   Extremities: No pretibial edema  Neuro: MS is good with appropriate affect, pt is alert and Ox3    DM foot exam: 03/28/2023   The skin of the feet is without sores or ulcerations, S/P left great toe amputation, 1 cm abrasion at the left 2nd dorsal surface of the toe  The pedal pulses are 2+ on right  and 2+ on left. The sensation is decreased to a screening 5.07, 10 gram monofilament bilaterally  DATA REVIEWED:  Lab Results  Component Value Date   HGBA1C 14.0 (H) 02/26/2023   HGBA1C 10.4 (H) 04/10/2022   HGBA1C 8.1 (A) 12/21/2021    Latest Reference Range & Units 02/28/23 04:18  Sodium 135 - 145 mmol/L 141  Potassium 3.5 - 5.1 mmol/L 4.2  Chloride 98 - 111 mmol/L 103  CO2 22 - 32 mmol/L 29  Glucose 70 - 99 mg/dL 295 (H)  BUN 6 - 20 mg/dL 28 (H)  Creatinine 6.21 - 1.24 mg/dL 3.08 (H)  Calcium 8.9 - 10.3 mg/dL 8.9  Anion gap 5 - 15  9  Phosphorus 2.5 - 4.6 mg/dL 3.5  Magnesium 1.7 - 2.4 mg/dL 2.3  GFR, Estimated >65 mL/min >60  (H): Data is abnormally high   ASSESSMENT / PLAN / RECOMMENDATIONS:   1) Type 1 Diabetes Mellitus, Poorly controlled, With neuropathic complications - Most recent A1c of 14.0%. Goal A1c < 7.0 %.    -Patient continues with hyperglycemia and recent DKA -I have referred him to our CDE for retraining on insulin pump technology and to troubleshoot the pump -I am going to again attempt to upgrade his CGM to Dexcom G7, he was advised to contact tandem to help him update the software. -I have increased his basal rate and adjusted his sensitivity factor -Unfortunately he continues not to bolus for meals   Pump   Tandem   Settings   Insulin type   Novolog    Basal rate       0000 0.710 u/h    0600 0.900   1700 0.800   2130 0.810          I:C ratio       0000 1:1                  Sensitivity       0000  45      Goal       0000  110          MEDICATIONS: NovoLog  EDUCATION / INSTRUCTIONS: BG monitoring instructions: Patient is instructed to check his blood sugars 3 times a day, before meals . Call [facility] if: BG persistently < 70  I reviewed the Rule of 15 for the treatment of hypoglycemia in detail with the patient. Literature supplied.   2) Diabetic complications:  Eye: Does  have known diabetic retinopathy.   Neuro/ Feet: Does not have known diabetic peripheral neuropathy. Renal: Patient does  have known baseline CKD. He is not on an ACEI/ARB at present.  3) Dyslipidemia :   - LDL acceptable  - Continue Rosuvastatin 20 mg daily   4)CKD III:  -His most recent GFR was normal -Continue losartan Medication  Continue losartan 25 mg daily  5) Left Toe Abrasion:  -No evidence of infection, patient continues to use topical antibiotic cream -Patient to follow-up with podiatry  Follow-up in 3 months   Signed electronically by: Lyndle Herrlich, MD  [facility]  [facility] [address] [place] [place], [place] [address] Phone: [phone] FAX: [phone]   CC: Emilio Aspen, MD [address] [place] [address] Phone: [phone]  Fax: [phone]    Return to Endocrinology clinic as below: Future Appointments  Date Time Provider Department Center  04/04/2023  1:15 PM Rennis Chris, MD [facility] None

## 2023-03-28 NOTE — Patient Instructions (Signed)

## 2023-03-29 ENCOUNTER — Telehealth: Payer: Self-pay

## 2023-03-29 NOTE — Telephone Encounter (Signed)
 Patient will stop by and pick up the G6 sample. Tandem will be sending over paperwork to order new pump for patient to use with the G7.  G6 placed up front

## 2023-04-02 ENCOUNTER — Other Ambulatory Visit: Payer: Self-pay | Admitting: Internal Medicine

## 2023-04-02 NOTE — Telephone Encounter (Signed)
 Medication refill request complete

## 2023-04-03 ENCOUNTER — Other Ambulatory Visit: Payer: Self-pay

## 2023-04-04 ENCOUNTER — Encounter (INDEPENDENT_AMBULATORY_CARE_PROVIDER_SITE_OTHER): Payer: 59 | Admitting: Ophthalmology

## 2023-04-04 DIAGNOSIS — H35033 Hypertensive retinopathy, bilateral: Secondary | ICD-10-CM

## 2023-04-04 DIAGNOSIS — E113593 Type 2 diabetes mellitus with proliferative diabetic retinopathy without macular edema, bilateral: Secondary | ICD-10-CM

## 2023-04-04 DIAGNOSIS — H4311 Vitreous hemorrhage, right eye: Secondary | ICD-10-CM

## 2023-04-04 DIAGNOSIS — I10 Essential (primary) hypertension: Secondary | ICD-10-CM

## 2023-04-24 ENCOUNTER — Other Ambulatory Visit: Payer: Self-pay

## 2023-04-24 ENCOUNTER — Emergency Department: Admission: EM | Admit: 2023-04-24 | Discharge: 2023-04-24 | Disposition: A | Discharge status: Home / Self Care

## 2023-04-24 DIAGNOSIS — N179 Acute kidney failure, unspecified: Secondary | ICD-10-CM | POA: Diagnosis not present

## 2023-04-24 DIAGNOSIS — E860 Dehydration: Secondary | ICD-10-CM | POA: Insufficient documentation

## 2023-04-24 DIAGNOSIS — E1022 Type 1 diabetes mellitus with diabetic chronic kidney disease: Secondary | ICD-10-CM | POA: Insufficient documentation

## 2023-04-24 DIAGNOSIS — I129 Hypertensive chronic kidney disease with stage 1 through stage 4 chronic kidney disease, or unspecified chronic kidney disease: Secondary | ICD-10-CM | POA: Diagnosis not present

## 2023-04-24 DIAGNOSIS — R112 Nausea with vomiting, unspecified: Secondary | ICD-10-CM

## 2023-04-24 DIAGNOSIS — N189 Chronic kidney disease, unspecified: Secondary | ICD-10-CM | POA: Insufficient documentation

## 2023-04-24 DIAGNOSIS — R1110 Vomiting, unspecified: Secondary | ICD-10-CM | POA: Diagnosis present

## 2023-04-24 LAB — CBC
HCT: 35.2 % — ABNORMAL LOW (ref 39.0–52.0)
Hemoglobin: 11.2 g/dL — ABNORMAL LOW (ref 13.0–17.0)
MCH: 27.1 pg (ref 26.0–34.0)
MCHC: 31.8 g/dL (ref 30.0–36.0)
MCV: 85.2 fL (ref 80.0–100.0)
Platelets: 152 10*3/uL (ref 150–400)
RBC: 4.13 MIL/uL — ABNORMAL LOW (ref 4.22–5.81)
RDW: 13.6 % (ref 11.5–15.5)
WBC: 4.7 10*3/uL (ref 4.0–10.5)
nRBC: 0 % (ref 0.0–0.2)

## 2023-04-24 LAB — LIPASE, BLOOD: Lipase: 40 U/L (ref 11–51)

## 2023-04-24 LAB — RESP PANEL BY RT-PCR (RSV, FLU A&B, COVID)  RVPGX2
Influenza A by PCR: NEGATIVE
Influenza B by PCR: NEGATIVE
Resp Syncytial Virus by PCR: NEGATIVE
SARS Coronavirus 2 by RT PCR: NEGATIVE

## 2023-04-24 LAB — COMPREHENSIVE METABOLIC PANEL WITH GFR
ALT: 27 U/L (ref 0–44)
AST: 21 U/L (ref 15–41)
Albumin: 3.3 g/dL — ABNORMAL LOW (ref 3.5–5.0)
Alkaline Phosphatase: 57 U/L (ref 38–126)
Anion gap: 9 (ref 5–15)
BUN: 50 mg/dL — ABNORMAL HIGH (ref 6–20)
CO2: 25 mmol/L (ref 22–32)
Calcium: 8.6 mg/dL — ABNORMAL LOW (ref 8.9–10.3)
Chloride: 107 mmol/L (ref 98–111)
Creatinine, Ser: 1.97 mg/dL — ABNORMAL HIGH (ref 0.61–1.24)
GFR, Estimated: 42 mL/min — ABNORMAL LOW (ref >60)
Glucose, Bld: 81 mg/dL (ref 70–99)
Potassium: 4.1 mmol/L (ref 3.5–5.1)
Sodium: 141 mmol/L (ref 135–145)
Total Bilirubin: 0.2 mg/dL (ref 0.0–1.2)
Total Protein: 6.7 g/dL (ref 6.5–8.1)

## 2023-04-24 LAB — CBG MONITORING, ED: Glucose-Capillary: 73 mg/dL (ref 70–99)

## 2023-04-24 MED ORDER — ONDANSETRON 4 MG PO TBDP: 4.0000 mg | ORAL_TABLET | Freq: Three times a day (TID) | ORAL | 0 refills | Status: DC | PRN

## 2023-04-24 MED ORDER — SODIUM CHLORIDE 0.9 % IV BOLUS
1000.0000 mL | Freq: Once | INTRAVENOUS | Status: AC
  Administered 2023-04-24: 1000 mL via INTRAVENOUS

## 2023-04-24 MED ORDER — ONDANSETRON HCL 4 MG/2ML IJ SOLN
4.0000 mg | Freq: Once | INTRAMUSCULAR | Status: AC
  Administered 2023-04-24: 4 mg via INTRAVENOUS
  Filled 2023-04-24: qty 2

## 2023-04-24 NOTE — ED Triage Notes (Addendum)
 Pt to ED via POV from home. Pt reports hyperglycemia and N/V. Pt reports believes he is in DKA. Pt actively vomiting. Fiance reports pt came home this am and reports CBG had been going up and down. Pt has insulin pump and is not reading at all.   CBG 73 in triage.

## 2023-04-24 NOTE — ED Provider Notes (Signed)
 [facility] Provider Note    Event Date/Time   First MD Initiated Contact with Patient 04/24/23 [phone]     (approximate)   History   Emesis   HPI  James [name] is a 43 y.o. 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  No clear underlying etiology, possible glucose monitor malfunction.  Addition to Semglee 20 units twice daily and NovoLog sliding scale.  Creatinine 2.37 on admission, discharge creatinine 1.33 after IV fluid. -Patient states starting yesterday evening he began feeling somewhat weak and had at least 2 episodes of vomiting overnight, nonbloody/nonbilious.  No diarrhea, no black or bloody stools.  No abdominal pain. -Has been taking his insulin as prescribed, did note a single glucose reading in the 400s on his glucose monitor, took his Lantus NovoLog dose, insulin pump is actively working -Accompanied with family member provides collateral -No recent fever, cough, shortness of breath, sore throat, urinary symptoms       Physical Exam   Triage Vital Signs: ED Triage Vitals  Encounter Vitals Group     BP 04/24/23 0723 (!) 164/109     Systolic BP Percentile --      Diastolic BP Percentile --      Pulse Rate 04/24/23 0723 (!) 120     Resp 04/24/23 0723 20     Temp 04/24/23 0723 98 F (36.7 C)     Temp Source 04/24/23 0723 Oral     SpO2 04/24/23 0723 98 %     Weight --      Height --      Head Circumference --      Peak Flow --      Pain Score 04/24/23 0724 0     Pain Loc --      Pain Education --      Exclude from Growth Chart --     Most recent vital signs: Vitals:   04/24/23 0723 04/24/23 1020  BP: (!) 164/109 (!) 144/81  Pulse: (!) 120 (!) 106  Resp: 20 18  Temp: 98 F (36.7  C)   SpO2: 98% 94%     General: Awake, no distress.  CV:  Good peripheral perfusion.  Tachycardic to about 100 at time of my eval, strong pulses, well-perfused. Resp:  Normal effort.  Clear lung sounds bilaterally, normal work of breathing. Abd:  No distention.  No tenderness to palpation throughout. Other:  Nontoxic-appearing   ED Results / Procedures / Treatments   Labs (all labs ordered are listed, but only abnormal results are displayed) Labs Reviewed  COMPREHENSIVE METABOLIC PANEL WITH GFR - Abnormal; Notable for the following components:      Result Value   BUN 50 (*)    Creatinine, Ser 1.97 (*)    Calcium 8.6 (*)    Albumin 3.3 (*)    GFR, Estimated 42 (*)    All other components within normal limits  CBC - Abnormal; Notable for the following components:   RBC 4.13 (*)    Hemoglobin 11.2 (*)    HCT 35.2 (*)    All other components within normal limits  RESP PANEL BY RT-PCR (RSV, FLU A&B, COVID)  RVPGX2  LIPASE, BLOOD  CBG MONITORING, ED     EKG  See ED course.   RADIOLOGY N/A  PROCEDURES:  Critical Care performed: No  Procedures   MEDICATIONS ORDERED IN ED: Medications  sodium chloride 0.9 % bolus 1,000 mL (0 mLs Intravenous Stopped 04/24/23 1030)  ondansetron (ZOFRAN) injection 4 mg (4 mg Intravenous Given 04/24/23 0845)     IMPRESSION / MDM / ASSESSMENT AND PLAN / ED COURSE  I reviewed the triage vital signs and the nursing notes.                              Differential diagnosis includes, but is not limited to, DKA, influenza, other viral syndrome including COVID-19.  Highly doubt acute intra-abdominal pathology including cholecystitis, pancreatitis, bowel obstruction.  Do not suspect underlying pneumonia or UTI at this time based on clinical history and exam.  Patient's presentation is most consistent with acute presentation with potential threat to life or bodily function.  Workup overall unremarkable.  Does have evidence of AKI and  dehydration, fortunately no evidence of DKA.  All symptoms resolved with treatment here in emergency department, see ED course below.  No further vomiting.  No evidence of underlying infection.  Insulin pump appears to be working appropriately.  Symptoms have resolved, patient looks and feels well, amenable to outpatient follow-up, will follow-up with PMD.  ED return precautions in place.  Patient family agree with plan.  Zofran, recommend oral hydration.  Clinical Course as of 04/24/23 1652  Tue Apr 24, 2023  0805 Glucose-Capillary: 74 [MM]  0818 CMP overall reassuring--AKI on CKD but otherwise [MM]  0825 Ecg = sinus tachycardia, rate 106, no ST elevation or depression, no significant repolarization abnormality, normal axis, normal intervals.  No evidence of acute ischemia on my read. [MM]  [phone] Patient reevaluated, notes he is feeling much better.  Responds all questions appropriately.  No further vomiting.  Reassured by unremarkable workup.  Heart rate has normalized.  Repeat abdominal exam with no tenderness to palpation throughout.  Do not clinically suspect acute underlying pathology.  Consider possible viral syndrome, flu testing and COVID testing pending.  Overall presentation not consistent with DKA.  Medication pump does appear to be working appropriately.  Will plan for discharge home with Zofran and continued oral hydration in setting of dehydration and AKI.  Plan for close PMD follow-up.  ED return precautions in place.  Patient and family agreed with plan. [MM]  1013 Covid/flu/rsv negative [MM]    Clinical Course User Index [MM] Marinell Blight, MD     FINAL CLINICAL IMPRESSION(S) / ED DIAGNOSES   Final diagnoses:  Dehydration  Nausea and vomiting, unspecified vomiting type  AKI (acute kidney injury) (HCC)     Rx / DC Orders   ED Discharge Orders          Ordered    ondansetron (ZOFRAN-ODT) 4 MG disintegrating tablet  Every 8 hours PRN        04/24/23 0946              Note:  This document was prepared using Dragon voice recognition software and may include unintentional dictation errors.   Marinell Blight, MD 04/24/23 [phone]

## 2023-04-24 NOTE — Discharge Instructions (Addendum)
 Evaluation in the emergency department was overall reassuring, and we saw no evidence of DKA today.  It is possibly may have a virus that is causing your vomiting, and you can check your patient portal to follow-up on your flu and COVID testing.  I prescribed her nausea medication to use at home.  Continue to use your diabetes medications as already prescribed and drink plenty water.  Please do follow-up closely with your primary care doctor for reevaluation, and return to the emergency department with any new or worsening symptoms.

## 2023-04-25 ENCOUNTER — Ambulatory Visit: Admitting: Nutrition

## 2023-05-02 ENCOUNTER — Telehealth: Payer: Self-pay | Admitting: Pharmacist

## 2023-05-02 NOTE — Progress Notes (Signed)
   05/02/2023  Patient ID: James [name], male   DOB: [birth year], 43 y.o.   MRN: [id number]  Called patient regarding medication needs and check-in after recent ER visit.  Unable to reach or leave voicemail at this time. Appears he has all of the medications he needs per fill history.   Ricka Burdock, PharmD [facility]  Phone Number: [phone]

## 2023-05-05 DIAGNOSIS — Z419 Encounter for procedure for purposes other than remedying health state, unspecified: Secondary | ICD-10-CM | POA: Diagnosis not present

## 2023-06-22 ENCOUNTER — Emergency Department
Admission: EM | Admit: 2023-06-22 | Discharge: 2023-06-22 | Disposition: A | Discharge status: Home / Self Care | Attending: Emergency Medicine | Admitting: Emergency Medicine

## 2023-06-22 ENCOUNTER — Other Ambulatory Visit: Payer: Self-pay

## 2023-06-22 DIAGNOSIS — E1022 Type 1 diabetes mellitus with diabetic chronic kidney disease: Secondary | ICD-10-CM | POA: Diagnosis not present

## 2023-06-22 DIAGNOSIS — N189 Chronic kidney disease, unspecified: Secondary | ICD-10-CM | POA: Insufficient documentation

## 2023-06-22 DIAGNOSIS — R7989 Other specified abnormal findings of blood chemistry: Secondary | ICD-10-CM | POA: Diagnosis present

## 2023-06-22 LAB — CBC
HCT: 34.7 % — ABNORMAL LOW (ref 39.0–52.0)
Hemoglobin: 11.1 g/dL — ABNORMAL LOW (ref 13.0–17.0)
MCH: 25.9 pg — ABNORMAL LOW (ref 26.0–34.0)
MCHC: 32 g/dL (ref 30.0–36.0)
MCV: 80.9 fL (ref 80.0–100.0)
Platelets: 177 10*3/uL (ref 150–400)
RBC: 4.29 MIL/uL (ref 4.22–5.81)
RDW: 13.3 % (ref 11.5–15.5)
WBC: 6 10*3/uL (ref 4.0–10.5)
nRBC: 0 % (ref 0.0–0.2)

## 2023-06-22 LAB — URINALYSIS, ROUTINE W REFLEX MICROSCOPIC
Bilirubin Urine: NEGATIVE
Glucose, UA: 150 mg/dL — AB
Hgb urine dipstick: NEGATIVE
Ketones, ur: NEGATIVE mg/dL
Leukocytes,Ua: NEGATIVE
Nitrite: NEGATIVE
Protein, ur: 30 mg/dL — AB
Specific Gravity, Urine: 1.02 (ref 1.005–1.030)
Squamous Epithelial / HPF: 0 /HPF (ref 0–5)
pH: 5 (ref 5.0–8.0)

## 2023-06-22 LAB — COMPREHENSIVE METABOLIC PANEL WITH GFR
ALT: 42 U/L (ref 0–44)
AST: 30 U/L (ref 15–41)
Albumin: 3.7 g/dL (ref 3.5–5.0)
Alkaline Phosphatase: 80 U/L (ref 38–126)
Anion gap: 11 (ref 5–15)
BUN: 46 mg/dL — ABNORMAL HIGH (ref 6–20)
CO2: 25 mmol/L (ref 22–32)
Calcium: 9 mg/dL (ref 8.9–10.3)
Chloride: 104 mmol/L (ref 98–111)
Creatinine, Ser: 2.08 mg/dL — ABNORMAL HIGH (ref 0.61–1.24)
GFR, Estimated: 40 mL/min — ABNORMAL LOW (ref >60)
Glucose, Bld: 149 mg/dL — ABNORMAL HIGH (ref 70–99)
Potassium: 3.7 mmol/L (ref 3.5–5.1)
Sodium: 140 mmol/L (ref 135–145)
Total Bilirubin: 0.5 mg/dL (ref 0.0–1.2)
Total Protein: 7.5 g/dL (ref 6.5–8.1)

## 2023-06-22 LAB — LIPASE, BLOOD: Lipase: 27 U/L (ref 11–51)

## 2023-06-22 LAB — CBG MONITORING, ED: Glucose-Capillary: 185 mg/dL — ABNORMAL HIGH (ref 70–99)

## 2023-06-22 NOTE — ED Notes (Signed)
 Patient left without discharge instructions. Dr. Alejo Amsler aware.

## 2023-06-22 NOTE — ED Triage Notes (Signed)
 Pt to ED via POV from home. Pt reports received a call to come in due to kidney function being elevated. Pt denies pain currently. Pt reports has had abd pain after eating.   Pt is type 1 DM and reports dexcon receiver went bad and is waiting on a new one.

## 2023-06-22 NOTE — ED Notes (Signed)
 ED Provider at bedside.

## 2023-06-22 NOTE — ED Provider Notes (Signed)
 [facility] Provider Note   Event Date/Time   First MD Initiated Contact with Patient 06/22/23 1742     (approximate) History  Abnormal Labs   HPI James [name] is a 43 y.o. male with a stated past medical history of type 1 diabetes who presents at the his doctor secondary to worsening renal function.  Patient had routine lab work and was told that his kidney function was decreasing and to present to the emergency department.  Patient denies any decrease in urination, increased thirst, back or abdominal pain ROS: Patient currently denies any vision changes, tinnitus, difficulty speaking, facial droop, sore throat, chest pain, shortness of breath, abdominal pain, nausea/vomiting/diarrhea, dysuria, or weakness/numbness/paresthesias in any extremity   Physical Exam  Triage Vital Signs: ED Triage Vitals  Encounter Vitals Group     BP 06/22/23 1610 94/66     Systolic BP Percentile --      Diastolic BP Percentile --      Pulse Rate 06/22/23 1610 87     Resp 06/22/23 1610 20     Temp 06/22/23 1610 97.6 F (36.4 C)     Temp src --      SpO2 06/22/23 1610 100 %     Weight --      Height --      Head Circumference --      Peak Flow --      Pain Score 06/22/23 1611 0     Pain Loc --      Pain Education --      Exclude from Growth Chart --    Most recent vital signs: Vitals:   06/22/23 1610  BP: 94/66  Pulse: 87  Resp: 20  Temp: 97.6 F (36.4 C)  SpO2: 100%   General: Awake, oriented x4. CV:  Good peripheral perfusion. Resp:  Normal effort. Abd:  No distention. Other:  Middle-aged well-developed, well-nourished African-American male resting comfortably in no acute distress ED Results / Procedures / Treatments  Labs (all labs ordered are listed, but only abnormal results are displayed) Labs Reviewed  COMPREHENSIVE METABOLIC PANEL WITH GFR - Abnormal; Notable for the following components:      Result Value   Glucose, Bld 149 (*)    BUN 46 (*)     Creatinine, Ser 2.08 (*)    GFR, Estimated 40 (*)    All other components within normal limits  CBC - Abnormal; Notable for the following components:   Hemoglobin 11.1 (*)    HCT 34.7 (*)    MCH 25.9 (*)    All other components within normal limits  URINALYSIS, ROUTINE W REFLEX MICROSCOPIC - Abnormal; Notable for the following components:   Color, Urine YELLOW (*)    APPearance HAZY (*)    Glucose, UA 150 (*)    Protein, ur 30 (*)    Bacteria, UA RARE (*)    All other components within normal limits  CBG MONITORING, ED - Abnormal; Notable for the following components:   Glucose-Capillary 185 (*)    All other components within normal limits  LIPASE, BLOOD   PROCEDURES: Critical Care performed: No Procedures MEDICATIONS ORDERED IN ED: Medications - No data to display IMPRESSION / MDM / ASSESSMENT AND PLAN / ED COURSE  I reviewed the triage vital signs and the nursing notes.                             The patient  is on the cardiac monitor to evaluate for evidence of arrhythmia and/or significant heart rate changes. Patient's presentation is most consistent with acute presentation with potential threat to life or bodily function. Suspect chronic kidney disease.  Patient's creatinine has been as high as 2.37 this year and has been 1.33 at its best.  This may be patient's new baseline. Doubt intrinsic renal dysfunction or obstructive nephropathy. Considered alternate etiologies of the patient's symptoms including infectious processes, severe metabolic derangements or electrolyte abnormalities, ischemia/ACS, heart failure, and intracranial/central processes but think these are unlikely given the history and physical exam.  Dispo: Discharge home with PCP follow-up   FINAL CLINICAL IMPRESSION(S) / ED DIAGNOSES   Final diagnoses:  Chronic kidney disease, unspecified CKD stage   Rx / DC Orders   ED Discharge Orders     None      Note:  This document was prepared using Dragon  voice recognition software and may include unintentional dictation errors.   Charleen Conn, MD 06/22/23 [phone]

## 2023-07-02 ENCOUNTER — Encounter: Payer: Self-pay | Admitting: Internal Medicine

## 2023-07-02 ENCOUNTER — Ambulatory Visit (INDEPENDENT_AMBULATORY_CARE_PROVIDER_SITE_OTHER): Admitting: Internal Medicine

## 2023-07-02 VITALS — BP 110/70 | HR 94 | Ht 74.0 in | Wt 162.0 lb

## 2023-07-02 DIAGNOSIS — E103593 Type 1 diabetes mellitus with proliferative diabetic retinopathy without macular edema, bilateral: Secondary | ICD-10-CM | POA: Diagnosis not present

## 2023-07-02 DIAGNOSIS — E1042 Type 1 diabetes mellitus with diabetic polyneuropathy: Secondary | ICD-10-CM | POA: Diagnosis not present

## 2023-07-02 DIAGNOSIS — E1065 Type 1 diabetes mellitus with hyperglycemia: Secondary | ICD-10-CM

## 2023-07-02 DIAGNOSIS — E785 Hyperlipidemia, unspecified: Secondary | ICD-10-CM

## 2023-07-02 LAB — POCT GLYCOSYLATED HEMOGLOBIN (HGB A1C): Hemoglobin A1C: 10.4 % — AB (ref 4.0–5.6)

## 2023-07-02 MED ORDER — ROSUVASTATIN CALCIUM 40 MG PO TABS: 40.0000 mg | ORAL_TABLET | Freq: Every day | ORAL | 3 refills | Status: AC

## 2023-07-02 NOTE — Progress Notes (Signed)
 Name: Johnwesley Lederman  MRN/ DOB: [id number], 1980/10/16   Age/ Sex: 43 y.o., male    PCP: Benedetta Bradley, MD   Reason for Endocrinology Evaluation: Type 1 Diabetes Mellitus     Date of Initial Endocrinology Visit: 12/21/2021    PATIENT IDENTIFIER: Mr. Raymar Joiner is a 43 y.o. male with a past medical history of T1DM. The patient presented for initial endocrinology clinic visit on 12/21/2021 for consultative assistance with his diabetes management.    HPI: Mr. Murrell was    Diagnosed with DM at age 13 Prior Medications tried/Intolerance: has been on the pump for years  Hemoglobin A1c has ranged from 8.3% in 2022, peaking at 9.2% in 2023.    SUBJECTIVE:   During the last visit (03/28/2023): 14.0%  Today (07/02/23): Garry Nicolini is here for follow-up on diabetes management He checks his  blood sugars multiple  times daily. The patient has not had hypoglycemic episodes since the last clinic visit. The patient is  symptomatic with these episodes.  Patient presented to the ED 04/2023 due to vomiting and dehydration.  Patient was diagnosed with AKI but no evidence of DKA, he was properly hydrated  He was sent to the ED again on 06/22/2023 due to worsening renal function, serum glucose 149 mg/DL, BUN 46, creatinine 4.09, GFR 40   He has not been feeling this morning, feezlin dizzy and weak and BG's were elevated.   Denies nausea or vomiting  Has had occasional diarrhea    This patient with type 1 diabetes is treated with Tandem  (insulin  pump). During the visit the pump basal and bolus doses were reviewed including carb/insulin  rations and supplemental doses. The clinical list was updated. The glucose meter download was reviewed in detail to determine if the current pump settings are providing the best glycemic control without excessive hypoglycemia.    Pump and meter download:    Pump   Tandem   Settings   Insulin  type   Novolog     Basal rate       0000 0.710 u/h     0600 0.900   1700 0.800   2130 0.810          I:C ratio       0000 1:1                  Sensitivity       0000  45      Goal       0000  110            Type & Model of Pump: Tandem  Insulin  Type: Currently using Novolog  .  Body mass index is 20.8 kg/m.  PUMP STATISTICS: Average BG: 239 Average Daily Carbs (g): 0 Average Total Daily Insulin : 44.14 Average Daily Basal: 22.16(50 %) Average Daily Bolus: 21.98 (50 %)    HOME DIABETES REGIMEN: Novolog   Losartan  25 mg daily     Statin: yes ACE-I/ARB: no   CONTINUOUS GLUCOSE MONITORING RECORD INTERPRETATION    Dates of Recording: 5/27-07/02/2023  Sensor description:dexcom G7  Results statistics:   CGM use % of time 77  Average and SD 239/108  Time in range 37 %  % Time Above 180 26  % Time Above 250 37  % Time Below target 0.3   Glycemic patterns summary: BG's trend down overnight and between the meals  Hyperglycemic episodes postprandial  Hypoglycemic episodes occurred N/A  Overnight periods: Trends down to optimal   DIABETIC COMPLICATIONS: Microvascular complications:  S/P left great toe amputation, retinopathy B/L ( he is on injection)  Denies: neuropathy Last eye exam: Completed 11/28/ 2023  Macrovascular complications:   Denies: CAD, PVD, CVA   PAST HISTORY: Past Medical History:  Past Medical History:  Diagnosis Date   Diabetes mellitus without complication (HCC)    Past Surgical History:  Past Surgical History:  Procedure Laterality Date   AMPUTATION TOE Left 03/13/2021   Procedure: AMPUTATION BIG TOE;  Surgeon: Velma Ghazi, DPM;  Location: [facility] ORS;  Service: Podiatry;  Laterality: Left;   COCHLEAR IMPLANT  05/27/2020   [facility]    Social History:  reports that he has never smoked. He has never used smokeless tobacco. He reports that he does not drink alcohol and does not use drugs. Family History:  Family History  Problem Relation Age of Onset   Diabetes Father       HOME MEDICATIONS: Allergies as of 07/02/2023   No Known Allergies      Medication List        Accurate as of July 02, 2023 10:05 AM. If you have any questions, ask your nurse or doctor.          acetaminophen  325 MG tablet Commonly known as: TYLENOL  Take 2 tablets (650 mg total) by mouth every 6 (six) hours as needed for mild pain (or Fever >/= 101).   atorvastatin 80 MG tablet Commonly known as: LIPITOR Take 80 mg by mouth daily.   cholecalciferol 25 MCG (1000 UNIT) tablet Commonly known as: VITAMIN D3 Take 1,000 Units by mouth daily.   Dexcom G7 Sensor Misc 1 Device by Does not apply route as directed.   ferrous gluconate  324 MG tablet Commonly known as: FERGON Take 324 mg by mouth. Take 1 tablet by mouth 3 (three) times a week   insulin  aspart 100 UNIT/ML injection Commonly known as: novoLOG  Max daily 30 units  per pump   losartan  25 MG tablet Commonly known as: COZAAR  Take 1 tablet (25 mg total) by mouth daily.   midodrine 5 MG tablet Commonly known as: PROAMATINE 2 tablets Orally twice daily for 30 day(s)   omeprazole 40 MG capsule Commonly known as: PRILOSEC 1 capsule.   ondansetron  4 MG disintegrating tablet Commonly known as: ZOFRAN -ODT Take 1 tablet (4 mg total) by mouth every 8 (eight) hours as needed for nausea or vomiting.   OneTouch Verio Flex System w/Device Kit SMARTSIG:device 1-3 Times Daily   pantoprazole  40 MG tablet Commonly known as: PROTONIX  Take 40 mg by mouth daily.   rosuvastatin  40 MG tablet Commonly known as: CRESTOR  Take 20 mg by mouth daily. What changed: Another medication with the same name was removed. Continue taking this medication, and follow the directions you see here. Changed by: Camilla Cedar Maude Gloor   T:slim X2 3mL Cartridge Misc CHANGE EVERY TWO (2) DAYS   TruSteel Infusion Set Misc CHANGE EVERY TWO (2) DAYS   T:slim X2 Control-IQ 7.8 Pump Devi USE AS DIRECTED   tamsulosin 0.4 MG Caps  capsule Commonly known as: FLOMAX 1 capsule.         ALLERGIES: No Known Allergies   REVIEW OF SYSTEMS: A comprehensive ROS was conducted with the patient and is negative except as per HPI     OBJECTIVE:   VITAL SIGNS: BP 110/70 (BP Location: Left Arm, Patient Position: Sitting, Cuff Size: Normal)   Pulse 94   Ht 6\' 2"  (1.88 m)   Wt 162 lb (73.5 kg)   SpO2 95%  BMI 20.80 kg/m    PHYSICAL EXAM:  General: Pt appears well and is in NAD  Lungs: Clear with good BS bilat   Heart: RRR   Extremities: No pretibial edema  Neuro: MS is good with appropriate affect, pt is alert and Ox3    DM foot exam: 03/28/2023   The skin of the feet is without sores or ulcerations, S/P left great toe amputation, 1 cm abrasion at the left 2nd dorsal surface of the toe  The pedal pulses are 2+ on right and 2+ on left. The sensation is decreased to a screening 5.07, 10 gram monofilament bilaterally  DATA REVIEWED:  Lab Results  Component Value Date   HGBA1C 10.4 (A) 07/02/2023   HGBA1C 14.0 (H) 02/26/2023   HGBA1C 10.4 (H) 04/10/2022     Latest Reference Range & Units 06/22/23 16:13  Sodium 135 - 145 mmol/L 140  Potassium 3.5 - 5.1 mmol/L 3.7  Chloride 98 - 111 mmol/L 104  CO2 22 - 32 mmol/L 25  Glucose 70 - 99 mg/dL 161 (H)  BUN 6 - 20 mg/dL 46 (H)  Creatinine 0.96 - 1.24 mg/dL 0.45 (H)  Calcium  8.9 - 10.3 mg/dL 9.0  Anion gap 5 - 15  11  Alkaline Phosphatase 38 - 126 U/L 80  Albumin 3.5 - 5.0 g/dL 3.7  Lipase 11 - 51 U/L 27  AST 15 - 41 U/L 30  ALT 0 - 44 U/L 42  Total Protein 6.5 - 8.1 g/dL 7.5  Total Bilirubin 0.0 - 1.2 mg/dL 0.5  GFR, Estimated >40 mL/min 40 (L)  Old records , labs and images have been reviewed.    ASSESSMENT / PLAN / RECOMMENDATIONS:   1) Type 1 Diabetes Mellitus, Poorly controlled, With neuropathic complications - Most recent A1c of 10.4%. Goal A1c < 7.0 %.    -Patient continues with hyperglycemia -I have referred him to our CDE for retraining on  insulin  pump technology and to troubleshoot the pump, but they have not been able to reach him - Unfortunately, he continues not to bolus with meals, I did again advise the patient the importance of bolusing with meals and snacks to prevent postprandial hyperglycemia, we discussed the risk of CKD and other microvascular complications - I will decrease basal rates during the day to prevent hypoglycemia  Pump   Tandem   Settings   Insulin  type   Novolog     Basal rate       0000 0.710 u/h    0600 0.85   1700 0.800   2130 0.810          I:C ratio       0000 1:1    Enter 8 g of carbohydrates with meals and 4 with snacks              Sensitivity       0000  45   0600 40   1700 40   2130 45      Goal       0000  110          MEDICATIONS: NovoLog   EDUCATION / INSTRUCTIONS: BG monitoring instructions: Patient is instructed to check his blood sugars 3 times a day, before meals . Call [facility] if: BG persistently < 70  I reviewed the Rule of 15 for the treatment of hypoglycemia in detail with the patient. Literature supplied.   2) Diabetic complications:  Eye: Does  have known diabetic retinopathy.  Neuro/ Feet: Does  not have known diabetic peripheral neuropathy. Renal: Patient does  have known baseline CKD. He is not on an ACEI/ARB at present.  3) Dyslipidemia :   - LDL acceptable  - Continue Rosuvastatin  40 mg daily   4)CKD III:  - His GFR continues to fluctuate, with recent diagnosis of AKI Medication  Continue losartan  25 mg daily   Follow-up in 3 months   Signed electronically by: Natale Bail, MD  [facility]  [facility] [address] [place] [place], [place] [address] Phone: [phone] FAX: [phone]   CC: Benedetta Bradley, MD [address] [place] [address] Phone: [phone]  Fax: [phone]    Return to Endocrinology clinic as below: Future  Appointments  Date Time Provider Department Center  07/10/2023 10:45 AM Velma Ghazi, DPM [facility]  07/16/2023  3:20 PM Pardue, Asencion Blacksmith, DO [facility]

## 2023-07-02 NOTE — Patient Instructions (Addendum)
 Please enter # 8 gram of carbohydrate with a meal and 4 g with a snack  -HOW TO TREAT LOW BLOOD SUGARS (Blood sugar LESS THAN 70 MG/DL) Please follow the RULE OF 15 for the treatment of hypoglycemia treatment (when your (blood sugars are less than 70 mg/dL)   STEP 1: Take 15 grams of carbohydrates when your blood sugar is low, which includes:  3-4 GLUCOSE TABS  OR 3-4 OZ OF JUICE OR REGULAR SODA OR ONE TUBE OF GLUCOSE GEL    STEP 2: RECHECK blood sugar in 15 MINUTES STEP 3: If your blood sugar is still low at the 15 minute recheck --> then, go back to STEP 1 and treat AGAIN with another 15 grams of carbohydrates.

## 2023-07-07 ENCOUNTER — Emergency Department: Admission: EM | Admit: 2023-07-07 | Discharge: 2023-07-07 | Disposition: A | Discharge status: Home / Self Care

## 2023-07-07 ENCOUNTER — Other Ambulatory Visit: Payer: Self-pay

## 2023-07-07 DIAGNOSIS — E1043 Type 1 diabetes mellitus with diabetic autonomic (poly)neuropathy: Secondary | ICD-10-CM | POA: Diagnosis not present

## 2023-07-07 DIAGNOSIS — K529 Noninfective gastroenteritis and colitis, unspecified: Secondary | ICD-10-CM | POA: Diagnosis not present

## 2023-07-07 DIAGNOSIS — E1010 Type 1 diabetes mellitus with ketoacidosis without coma: Secondary | ICD-10-CM | POA: Insufficient documentation

## 2023-07-07 DIAGNOSIS — R197 Diarrhea, unspecified: Secondary | ICD-10-CM | POA: Diagnosis present

## 2023-07-07 LAB — CBC
HCT: 35.2 % — ABNORMAL LOW (ref 39.0–52.0)
Hemoglobin: 10.9 g/dL — ABNORMAL LOW (ref 13.0–17.0)
MCH: 25.7 pg — ABNORMAL LOW (ref 26.0–34.0)
MCHC: 31 g/dL (ref 30.0–36.0)
MCV: 83 fL (ref 80.0–100.0)
Platelets: 161 10*3/uL (ref 150–400)
RBC: 4.24 MIL/uL (ref 4.22–5.81)
RDW: 13.8 % (ref 11.5–15.5)
WBC: 6.5 10*3/uL (ref 4.0–10.5)
nRBC: 0 % (ref 0.0–0.2)

## 2023-07-07 LAB — COMPREHENSIVE METABOLIC PANEL WITH GFR
ALT: 37 U/L (ref 0–44)
AST: 30 U/L (ref 15–41)
Albumin: 3.7 g/dL (ref 3.5–5.0)
Alkaline Phosphatase: 59 U/L (ref 38–126)
Anion gap: 9 (ref 5–15)
BUN: 58 mg/dL — ABNORMAL HIGH (ref 6–20)
CO2: 24 mmol/L (ref 22–32)
Calcium: 8.9 mg/dL (ref 8.9–10.3)
Chloride: 108 mmol/L (ref 98–111)
Creatinine, Ser: 2.07 mg/dL — ABNORMAL HIGH (ref 0.61–1.24)
GFR, Estimated: 40 mL/min — ABNORMAL LOW (ref >60)
Glucose, Bld: 156 mg/dL — ABNORMAL HIGH (ref 70–99)
Potassium: 4.6 mmol/L (ref 3.5–5.1)
Sodium: 141 mmol/L (ref 135–145)
Total Bilirubin: 0.8 mg/dL (ref 0.0–1.2)
Total Protein: 7.2 g/dL (ref 6.5–8.1)

## 2023-07-07 LAB — URINALYSIS, ROUTINE W REFLEX MICROSCOPIC
Bilirubin Urine: NEGATIVE
Glucose, UA: 50 mg/dL — AB
Ketones, ur: NEGATIVE mg/dL
Leukocytes,Ua: NEGATIVE
Nitrite: NEGATIVE
Protein, ur: NEGATIVE mg/dL
Specific Gravity, Urine: 1.017 (ref 1.005–1.030)
pH: 5 (ref 5.0–8.0)

## 2023-07-07 LAB — LIPASE, BLOOD: Lipase: 25 U/L (ref 11–51)

## 2023-07-07 LAB — RESP PANEL BY RT-PCR (RSV, FLU A&B, COVID)  RVPGX2
Influenza A by PCR: NEGATIVE
Influenza B by PCR: NEGATIVE
Resp Syncytial Virus by PCR: NEGATIVE
SARS Coronavirus 2 by RT PCR: NEGATIVE

## 2023-07-07 MED ORDER — ONDANSETRON 4 MG PO TBDP
4.0000 mg | ORAL_TABLET | Freq: Once | ORAL | Status: AC | PRN
  Administered 2023-07-07: 4 mg via ORAL
  Filled 2023-07-07: qty 1

## 2023-07-07 MED ORDER — ONDANSETRON 4 MG PO TBDP: 4.0000 mg | ORAL_TABLET | Freq: Three times a day (TID) | ORAL | 0 refills | Status: DC | PRN

## 2023-07-07 MED ORDER — SODIUM CHLORIDE 0.9 % IV BOLUS
1000.0000 mL | Freq: Once | INTRAVENOUS | Status: AC
  Administered 2023-07-07: 1000 mL via INTRAVENOUS

## 2023-07-07 NOTE — ED Triage Notes (Signed)
 C/o N/V/D that started this AM. Denies fever and any other complaints. GCS 15. PMH: DM, gastroparesis

## 2023-07-07 NOTE — Discharge Instructions (Signed)
 Please follow up with your primary care provider if symptoms continue for more than 48 hours.  Return to the ER for concerns if unable to schedule an appointment.  Stay well hydrated with clear liquids today and progress to bland foods tomorrow.

## 2023-07-07 NOTE — ED Provider Notes (Signed)
 [facility] Provider Note    Event Date/Time   First MD Initiated Contact with Patient 07/07/23 1013     (approximate)   History   Emesis and Diarrhea   HPI  James [name] is a 43 y.o. male  with history of gastroparesis, DM1, DKA and as listed in EMR presents to the emergency department for treatment and evaluation of nausea, vomiting, and diarrhea. He had eaten at [facility] yesterday evening then around 4am started having diarrhea and vomiting. No fever. No known sick contacts.      Physical Exam   Triage Vital Signs: ED Triage Vitals [07/07/23 0904]  Encounter Vitals Group     BP 109/68     Girls Systolic BP Percentile      Girls Diastolic BP Percentile      Boys Systolic BP Percentile      Boys Diastolic BP Percentile      Pulse Rate (!) 107     Resp 16     Temp 99.2 F (37.3 C)     Temp src      SpO2 98 %     Weight      Height      Head Circumference      Peak Flow      Pain Score      Pain Loc      Pain Education      Exclude from Growth Chart     Most recent vital signs: Vitals:   07/07/23 1152 07/07/23 1241  BP:  107/68  Pulse:  93  Resp:  16  Temp: 98.8 F (37.1 C)   SpO2:  99%    General: Awake, no distress.  CV:  Good peripheral perfusion.  Resp:  Normal effort.  Abd:  No distention.  Other:     ED Results / Procedures / Treatments   Labs (all labs ordered are listed, but only abnormal results are displayed) Labs Reviewed  COMPREHENSIVE METABOLIC PANEL WITH GFR - Abnormal; Notable for the following components:      Result Value   Glucose, Bld 156 (*)    BUN 58 (*)    Creatinine, Ser 2.07 (*)    GFR, Estimated 40 (*)    All other components within normal limits  CBC - Abnormal; Notable for the following components:   Hemoglobin 10.9 (*)    HCT 35.2 (*)    MCH 25.7 (*)    All other components within normal limits  URINALYSIS, ROUTINE W REFLEX MICROSCOPIC - Abnormal; Notable for the following  components:   Color, Urine YELLOW (*)    APPearance HAZY (*)    Glucose, UA 50 (*)    Hgb urine dipstick SMALL (*)    Bacteria, UA RARE (*)    All other components within normal limits  RESP PANEL BY RT-PCR (RSV, FLU A&B, COVID)  RVPGX2  LIPASE, BLOOD     EKG  Not indicated   RADIOLOGY  Image and radiology report reviewed and interpreted by me. Radiology report consistent with the same.  Not indicated  PROCEDURES:  Critical Care performed: No  Procedures   MEDICATIONS ORDERED IN ED:  Medications  ondansetron  (ZOFRAN -ODT) disintegrating tablet 4 mg (4 mg Oral Given 07/07/23 0907)  sodium chloride  0.9 % bolus 1,000 mL (0 mLs Intravenous Stopped 07/07/23 1152)     IMPRESSION / MDM / ASSESSMENT AND PLAN / ED COURSE   I have reviewed the triage note.  Differential diagnosis includes, but  is not limited to, viral gastroenteritis, gastroparesis, DKA, hyperglycemia  Patient's presentation is most consistent with acute complicated illness / injury requiring diagnostic workup.  43 year old male presenting to the emergency department for GI evaluation of vomiting and diarrhea that started around 4 AM.  He has had approximately 5 episodes of each.  Type I diabetic and reports that his glucose numbers have been around 120 according to his Dexcom.  Vital signs are reassuring.  He is afebrile and not tachycardic.  Labs are all at or near patient's baseline.  He has a stable anemia.  Serum glucose is 156.  BUN and creatinine 58 and 2.07 with a GFR of 40 which is consistent with labs drawn about 2 weeks ago.  No further vomiting after Zofran .  1 L of normal saline infused.  Patient feeling better.  Lab results reviewed with him and significant other.  He was advised to remain on clear liquids the rest of today and then progressed to bland foods as tolerated tomorrow.  ER return precautions and outpatient follow-up instructions were discussed.      FINAL CLINICAL IMPRESSION(S) /  ED DIAGNOSES   Final diagnoses:  Gastroenteritis     Rx / DC Orders   ED Discharge Orders          Ordered    ondansetron  (ZOFRAN -ODT) 4 MG disintegrating tablet  Every 8 hours PRN        07/07/23 1230             Note:  This document was prepared using Dragon voice recognition software and may include unintentional dictation errors.   Sherryle Don, FNP 07/07/23 1449    Collis Deaner, MD 07/08/23 [phone]

## 2023-07-10 ENCOUNTER — Ambulatory Visit (INDEPENDENT_AMBULATORY_CARE_PROVIDER_SITE_OTHER): Admitting: Podiatry

## 2023-07-10 DIAGNOSIS — L97521 Non-pressure chronic ulcer of other part of left foot limited to breakdown of skin: Secondary | ICD-10-CM | POA: Diagnosis not present

## 2023-07-10 MED ORDER — SILVER SULFADIAZINE 1 % EX CREA: 1.0000 | TOPICAL_CREAM | Freq: Every day | CUTANEOUS | 0 refills | Status: AC

## 2023-07-10 NOTE — Progress Notes (Signed)
 Subjective:  Patient ID: James [name], male    DOB: 15-Feb-1980,  MRN: [id number]  Chief Complaint  Patient presents with   Callouses    43 y.o. male presents with the above complaint.  Patient presents with left second digit ulceration limited to the breakdown of the skin.  Patient states that it has been present for quite some time.  He thinks the steel toe shoe may have caused this.  He is a diabetic with last A1c of 10%.  He would like to discuss treatment options for this has not seen anyone else prior to seeing me for this.  Denies any other acute complaints.   Review of Systems: Negative except as noted in the HPI. Denies N/V/F/Ch.  Past Medical History:  Diagnosis Date   Diabetes mellitus without complication (HCC)     Current Outpatient Medications:    silver sulfADIAZINE (SILVADENE) 1 % cream, Apply 1 Application topically daily., Disp: 50 g, Rfl: 0   acetaminophen  (TYLENOL ) 325 MG tablet, Take 2 tablets (650 mg total) by mouth every 6 (six) hours as needed for mild pain (or Fever >/= 101)., Disp: , Rfl:    Blood Glucose Monitoring Suppl (ONETOUCH VERIO FLEX SYSTEM) w/Device KIT, SMARTSIG:device 1-3 Times Daily, Disp: , Rfl:    cholecalciferol (VITAMIN D3) 25 MCG (1000 UNIT) tablet, Take 1,000 Units by mouth daily., Disp: , Rfl:    Continuous Glucose Sensor (DEXCOM G7 SENSOR) MISC, 1 Device by Does not apply route as directed., Disp: 9 each, Rfl: 3   ferrous gluconate  (FERGON) 324 MG tablet, Take 324 mg by mouth. Take 1 tablet by mouth 3 (three) times a week, Disp: , Rfl:    insulin  aspart (NOVOLOG ) 100 UNIT/ML injection, Max daily 30 units  per pump, Disp: 30 mL, Rfl: 3   Insulin  Infusion Pump (T:SLIM X2 CONTROL-IQ 7.8 PUMP) DEVI, USE AS DIRECTED, Disp: 1 each, Rfl: 0   Insulin  Infusion Pump Supplies (T:SLIM X2 CARTRIDGE) MISC, CHANGE EVERY TWO (2) DAYS, Disp: 50 each, Rfl: 2   Insulin  Infusion Pump Supplies (TRUSTEEL INFUSION SET) MISC, CHANGE EVERY TWO (2) DAYS, Disp:  50 each, Rfl: 2   losartan  (COZAAR ) 25 MG tablet, Take 1 tablet (25 mg total) by mouth daily., Disp: 90 tablet, Rfl: 3   midodrine (PROAMATINE) 5 MG tablet, 2 tablets Orally twice daily for 30 day(s), Disp: , Rfl:    omeprazole (PRILOSEC) 40 MG capsule, 1 capsule., Disp: , Rfl:    ondansetron  (ZOFRAN -ODT) 4 MG disintegrating tablet, Take 1 tablet (4 mg total) by mouth every 8 (eight) hours as needed for nausea or vomiting., Disp: 20 tablet, Rfl: 0   pantoprazole  (PROTONIX ) 40 MG tablet, Take 40 mg by mouth daily., Disp: , Rfl:    rosuvastatin  (CRESTOR ) 40 MG tablet, Take 1 tablet (40 mg total) by mouth daily., Disp: 90 tablet, Rfl: 3   tamsulosin (FLOMAX) 0.4 MG CAPS capsule, 1 capsule., Disp: , Rfl:   Social History   Tobacco Use  Smoking Status Never  Smokeless Tobacco Never    No Known Allergies Objective:  There were no vitals filed for this visit. There is no height or weight on file to calculate BMI. Constitutional Well developed. Well nourished.  Vascular Dorsalis pedis pulses palpable bilaterally. Posterior tibial pulses palpable bilaterally. Capillary refill normal to all digits.  No cyanosis or clubbing noted. Pedal hair growth normal.  Neurologic Normal speech. Oriented to person, place, and time. Epicritic sensation to light touch grossly present bilaterally.  Dermatologic Left  second digit ulceration limited to the breakdown of the skin no exposure of bone noted no purulent drainage noted dry wound noted.  No cellulitis or erythema noted  Orthopedic: Normal joint ROM without pain or crepitus bilaterally. No visible deformities. No bony tenderness.   Radiographs: None Assessment:   1. Toe ulcer, left, limited to breakdown of skin [facility])    Plan:  Patient was evaluated and treated and all questions answered.  Left second digit ulceration limited to the breakdown of the skin - All question concerns were discussed with the patient in extensive detail - Given the  amount of breakdown that is present in the setting of failed Betadine wet-to-dry dressing encouraged him to do Silvadene cream.  Silvadene cream and Band-Aid was applied - Silvadene was sent to the pharmacy I encouraged him to do once a day dressing change he states understanding - Encouraged him to come out of the steel toe boots over regular shoes however patient is unable to do so and he would like to continue working - He is a high risk of undergoing second digit amputation he has a history of prior hallux amputation - Encouraged him go to the emergency room if the wound regresses he states understanding  No follow-ups on file.

## 2023-07-16 ENCOUNTER — Ambulatory Visit: Admitting: Family Medicine

## 2023-09-26 ENCOUNTER — Observation Stay
Admission: EM | Admit: 2023-09-26 | Discharge: 2023-09-27 | Disposition: A | Discharge status: Home / Self Care | Attending: Internal Medicine | Admitting: Internal Medicine

## 2023-09-26 DIAGNOSIS — R739 Hyperglycemia, unspecified: Secondary | ICD-10-CM | POA: Diagnosis present

## 2023-09-26 DIAGNOSIS — N189 Chronic kidney disease, unspecified: Secondary | ICD-10-CM | POA: Insufficient documentation

## 2023-09-26 DIAGNOSIS — I129 Hypertensive chronic kidney disease with stage 1 through stage 4 chronic kidney disease, or unspecified chronic kidney disease: Secondary | ICD-10-CM | POA: Diagnosis not present

## 2023-09-26 DIAGNOSIS — Z862 Personal history of diseases of the blood and blood-forming organs and certain disorders involving the immune mechanism: Secondary | ICD-10-CM

## 2023-09-26 DIAGNOSIS — E1010 Type 1 diabetes mellitus with ketoacidosis without coma: Principal | ICD-10-CM | POA: Insufficient documentation

## 2023-09-26 DIAGNOSIS — N179 Acute kidney failure, unspecified: Secondary | ICD-10-CM | POA: Insufficient documentation

## 2023-09-26 DIAGNOSIS — N400 Enlarged prostate without lower urinary tract symptoms: Secondary | ICD-10-CM | POA: Diagnosis not present

## 2023-09-26 DIAGNOSIS — Z7982 Long term (current) use of aspirin: Secondary | ICD-10-CM | POA: Insufficient documentation

## 2023-09-26 DIAGNOSIS — E1065 Type 1 diabetes mellitus with hyperglycemia: Secondary | ICD-10-CM | POA: Diagnosis present

## 2023-09-26 DIAGNOSIS — R112 Nausea with vomiting, unspecified: Secondary | ICD-10-CM | POA: Diagnosis not present

## 2023-09-26 DIAGNOSIS — Z794 Long term (current) use of insulin: Secondary | ICD-10-CM | POA: Insufficient documentation

## 2023-09-26 DIAGNOSIS — E785 Hyperlipidemia, unspecified: Secondary | ICD-10-CM | POA: Diagnosis not present

## 2023-09-26 LAB — COMPREHENSIVE METABOLIC PANEL WITH GFR
ALT: 124 U/L — ABNORMAL HIGH (ref 0–44)
AST: 59 U/L — ABNORMAL HIGH (ref 15–41)
Albumin: 4.3 g/dL (ref 3.5–5.0)
Alkaline Phosphatase: 124 U/L (ref 38–126)
Anion gap: 16 — ABNORMAL HIGH (ref 5–15)
BUN: 53 mg/dL — ABNORMAL HIGH (ref 6–20)
CO2: 22 mmol/L (ref 22–32)
Calcium: 10.1 mg/dL (ref 8.9–10.3)
Chloride: 102 mmol/L (ref 98–111)
Creatinine, Ser: 2.66 mg/dL — ABNORMAL HIGH (ref 0.61–1.24)
GFR, Estimated: 30 mL/min — ABNORMAL LOW (ref >60)
Glucose, Bld: 471 mg/dL — ABNORMAL HIGH (ref 70–99)
Potassium: 4.2 mmol/L (ref 3.5–5.1)
Sodium: 140 mmol/L (ref 135–145)
Total Bilirubin: 0.8 mg/dL (ref 0.0–1.2)
Total Protein: 8.8 g/dL — ABNORMAL HIGH (ref 6.5–8.1)

## 2023-09-26 LAB — CBC WITH DIFFERENTIAL/PLATELET
Abs Immature Granulocytes: 0.02 K/uL (ref 0.00–0.07)
Basophils Absolute: 0 K/uL (ref 0.0–0.1)
Basophils Relative: 0 %
Eosinophils Absolute: 0 K/uL (ref 0.0–0.5)
Eosinophils Relative: 0 %
HCT: 41.2 % (ref 39.0–52.0)
Hemoglobin: 13.2 g/dL (ref 13.0–17.0)
Immature Granulocytes: 0 %
Lymphocytes Relative: 7 %
Lymphs Abs: 0.6 K/uL — ABNORMAL LOW (ref 0.7–4.0)
MCH: 26.1 pg (ref 26.0–34.0)
MCHC: 32 g/dL (ref 30.0–36.0)
MCV: 81.6 fL (ref 80.0–100.0)
Monocytes Absolute: 0.3 K/uL (ref 0.1–1.0)
Monocytes Relative: 4 %
Neutro Abs: 7.7 K/uL (ref 1.7–7.7)
Neutrophils Relative %: 89 %
Platelets: 204 K/uL (ref 150–400)
RBC: 5.05 MIL/uL (ref 4.22–5.81)
RDW: 13.7 % (ref 11.5–15.5)
WBC: 8.7 K/uL (ref 4.0–10.5)
nRBC: 0 % (ref 0.0–0.2)

## 2023-09-26 LAB — URINALYSIS, ROUTINE W REFLEX MICROSCOPIC
Bacteria, UA: NONE SEEN
Bilirubin Urine: NEGATIVE
Glucose, UA: >=500 mg/dL — AB
Hgb urine dipstick: NEGATIVE
Ketones, ur: NEGATIVE mg/dL
Leukocytes,Ua: NEGATIVE
Nitrite: NEGATIVE
Protein, ur: NEGATIVE mg/dL
Specific Gravity, Urine: 1.023 (ref 1.005–1.030)
Squamous Epithelial / HPF: 0 /HPF (ref 0–5)
pH: 5 (ref 5.0–8.0)

## 2023-09-26 LAB — BASIC METABOLIC PANEL WITH GFR
Anion gap: 13 (ref 5–15)
BUN: 52 mg/dL — ABNORMAL HIGH (ref 6–20)
CO2: 25 mmol/L (ref 22–32)
Calcium: 9.7 mg/dL (ref 8.9–10.3)
Chloride: 105 mmol/L (ref 98–111)
Creatinine, Ser: 2.21 mg/dL — ABNORMAL HIGH (ref 0.61–1.24)
GFR, Estimated: 37 mL/min — ABNORMAL LOW (ref >60)
Glucose, Bld: 258 mg/dL — ABNORMAL HIGH (ref 70–99)
Potassium: 4.6 mmol/L (ref 3.5–5.1)
Sodium: 143 mmol/L (ref 135–145)

## 2023-09-26 LAB — BETA-HYDROXYBUTYRIC ACID: Beta-Hydroxybutyric Acid: 0.11 mmol/L (ref 0.05–0.27)

## 2023-09-26 LAB — CBG MONITORING, ED
Glucose-Capillary: 473 mg/dL — ABNORMAL HIGH (ref 70–99)
Glucose-Capillary: 285 mg/dL — ABNORMAL HIGH (ref 70–99)
Glucose-Capillary: 225 mg/dL — ABNORMAL HIGH (ref 70–99)
Glucose-Capillary: 268 mg/dL — ABNORMAL HIGH (ref 70–99)

## 2023-09-26 LAB — MAGNESIUM: Magnesium: 2.5 mg/dL — ABNORMAL HIGH (ref 1.7–2.4)

## 2023-09-26 LAB — LIPASE, BLOOD: Lipase: 29 U/L (ref 11–51)

## 2023-09-26 MED ORDER — MAGNESIUM HYDROXIDE 400 MG/5ML PO SUSP: 30.0000 mL | Freq: Every day | ORAL | Status: DC | PRN

## 2023-09-26 MED ORDER — POTASSIUM CHLORIDE 10 MEQ/100ML IV SOLN
10.0000 meq | INTRAVENOUS | Status: AC
  Administered 2023-09-26 (×2): 10 meq via INTRAVENOUS
  Filled 2023-09-26 (×2): qty 100

## 2023-09-26 MED ORDER — ACETAMINOPHEN 325 MG PO TABS: 650.0000 mg | ORAL_TABLET | Freq: Four times a day (QID) | ORAL | Status: DC | PRN

## 2023-09-26 MED ORDER — ONDANSETRON HCL 4 MG/2ML IJ SOLN
4.0000 mg | Freq: Once | INTRAMUSCULAR | Status: AC
  Administered 2023-09-26: 4 mg via INTRAVENOUS
  Filled 2023-09-26: qty 2

## 2023-09-26 MED ORDER — METOCLOPRAMIDE HCL 5 MG/ML IJ SOLN
10.0000 mg | Freq: Four times a day (QID) | INTRAMUSCULAR | Status: DC
  Administered 2023-09-26: 10 mg via INTRAVENOUS
  Filled 2023-09-26 (×2): qty 2

## 2023-09-26 MED ORDER — INSULIN PUMP: 1.0000 | SUBCUTANEOUS | Status: DC

## 2023-09-26 MED ORDER — LACTATED RINGERS IV BOLUS
1000.0000 mL | Freq: Once | INTRAVENOUS | Status: AC
  Administered 2023-09-26: 1000 mL via INTRAVENOUS

## 2023-09-26 MED ORDER — FERROUS GLUCONATE 324 (38 FE) MG PO TABS
324.0000 mg | ORAL_TABLET | Freq: Three times a day (TID) | ORAL | Status: DC
  Administered 2023-09-27 (×2): 324 mg via ORAL
  Filled 2023-09-26 (×3): qty 1

## 2023-09-26 MED ORDER — VITAMIN D 25 MCG (1000 UNIT) PO TABS
1000.0000 [IU] | ORAL_TABLET | Freq: Every day | ORAL | Status: DC
  Administered 2023-09-26 – 2023-09-27 (×2): 1000 [IU] via ORAL
  Filled 2023-09-26 (×2): qty 1

## 2023-09-26 MED ORDER — ENOXAPARIN SODIUM 40 MG/0.4ML IJ SOSY
40.0000 mg | PREFILLED_SYRINGE | INTRAMUSCULAR | Status: DC
  Administered 2023-09-27: 40 mg via SUBCUTANEOUS
  Filled 2023-09-26: qty 0.4

## 2023-09-26 MED ORDER — INSULIN REGULAR(HUMAN) IN NACL 100-0.9 UT/100ML-% IV SOLN
INTRAVENOUS | Status: DC
  Administered 2023-09-26: 7 [IU]/h via INTRAVENOUS
  Filled 2023-09-26: qty 100

## 2023-09-26 MED ORDER — LOSARTAN POTASSIUM 50 MG PO TABS: 25.0000 mg | ORAL_TABLET | Freq: Every day | ORAL | Status: DC

## 2023-09-26 MED ORDER — ACETAMINOPHEN 650 MG RE SUPP: 650.0000 mg | Freq: Four times a day (QID) | RECTAL | Status: DC | PRN

## 2023-09-26 MED ORDER — PANTOPRAZOLE SODIUM 40 MG IV SOLR
40.0000 mg | Freq: Two times a day (BID) | INTRAVENOUS | Status: DC
  Administered 2023-09-26 – 2023-09-27 (×2): 40 mg via INTRAVENOUS
  Filled 2023-09-26 (×2): qty 10

## 2023-09-26 MED ORDER — ONDANSETRON HCL 4 MG/2ML IJ SOLN: 4.0000 mg | Freq: Four times a day (QID) | INTRAMUSCULAR | Status: DC | PRN

## 2023-09-26 MED ORDER — TRAZODONE HCL 50 MG PO TABS: 25.0000 mg | ORAL_TABLET | Freq: Every evening | ORAL | Status: DC | PRN

## 2023-09-26 MED ORDER — DEXTROSE IN LACTATED RINGERS 5 % IV SOLN: INTRAVENOUS | Status: DC

## 2023-09-26 MED ORDER — SODIUM CHLORIDE 0.9 % IV SOLN: INTRAVENOUS | Status: DC

## 2023-09-26 MED ORDER — ONDANSETRON HCL 4 MG PO TABS: 4.0000 mg | ORAL_TABLET | Freq: Four times a day (QID) | ORAL | Status: DC | PRN

## 2023-09-26 MED ORDER — LACTATED RINGERS IV SOLN: INTRAVENOUS | Status: DC

## 2023-09-26 MED ORDER — ROSUVASTATIN CALCIUM 20 MG PO TABS
40.0000 mg | ORAL_TABLET | Freq: Every day | ORAL | Status: DC
  Filled 2023-09-26: qty 2

## 2023-09-26 MED ORDER — DEXTROSE 50 % IV SOLN: 0.0000 mL | INTRAVENOUS | Status: DC | PRN

## 2023-09-26 NOTE — Assessment & Plan Note (Signed)
-   Continue Flomax

## 2023-09-26 NOTE — Assessment & Plan Note (Signed)
-   Will hold off statin therapy and follow LFTs with hydration.

## 2023-09-26 NOTE — ED Provider Notes (Signed)
 [facility] Provider Note    Event Date/Time   First MD Initiated Contact with Patient 09/26/23 1850     (approximate)   History   Hyperglycemia   HPI  James [name] is a 43 y.o. male who presents to the ED for evaluation of Hyperglycemia   I reviewed endocrine clinic visit from June.  Type I diabetic, diagnosed at age 51.  A1c of 14 back in March.  Patient presents to the ED with nausea, vomiting and generalized malaise consistent with previous episodes of DKA.   Physical Exam   Triage Vital Signs: ED Triage Vitals  Encounter Vitals Group     BP 09/26/23 1834 (!) 86/73     Girls Systolic BP Percentile --      Girls Diastolic BP Percentile --      Boys Systolic BP Percentile --      Boys Diastolic BP Percentile --      Pulse Rate 09/26/23 1834 (!) 114     Resp 09/26/23 1834 (!) 26     Temp --      Temp src --      SpO2 09/26/23 1834 100 %     Weight --      Height --      Head Circumference --      Peak Flow --      Pain Score 09/26/23 1836 7     Pain Loc --      Pain Education --      Exclude from Growth Chart --     Most recent vital signs: Vitals:   09/26/23 1834  BP: (!) 86/73  Pulse: (!) 114  Resp: (!) 26  SpO2: 100%    General: Awake, no distress.  Looks unwell, dry mucous membranes CV:  Good peripheral perfusion.  Tachycardic and regular Resp:  Normal effort.  Abd:  No distention.  Soft MSK:  No deformity noted.  Neuro:  No focal deficits appreciated. Other:     ED Results / Procedures / Treatments   Labs (all labs ordered are listed, but only abnormal results are displayed) Labs Reviewed  COMPREHENSIVE METABOLIC PANEL WITH GFR - Abnormal; Notable for the following components:      Result Value   Glucose, Bld 471 (*)    BUN 53 (*)    Creatinine, Ser 2.66 (*)    Total Protein 8.8 (*)    AST 59 (*)    ALT 124 (*)    GFR, Estimated 30 (*)    Anion gap 16 (*)    All other components within normal limits   CBC WITH DIFFERENTIAL/PLATELET - Abnormal; Notable for the following components:   Lymphs Abs 0.6 (*)    All other components within normal limits  CBG MONITORING, ED - Abnormal; Notable for the following components:   Glucose-Capillary 473 (*)    All other components within normal limits  BLOOD GAS, VENOUS  BETA-HYDROXYBUTYRIC ACID  MAGNESIUM   URINALYSIS, ROUTINE W REFLEX MICROSCOPIC  LIPASE, BLOOD  CBG MONITORING, ED    EKG   RADIOLOGY   Official radiology report(s): No results found.  PROCEDURES and INTERVENTIONS:  .Critical Care  Performed by: Claudene Rover, MD Authorized by: Claudene Rover, MD   Critical care provider statement:    Critical care time (minutes):  30   Critical care time was exclusive of:  Separately billable procedures and treating other patients   Critical care was necessary to treat or prevent imminent or life-threatening deterioration  of the following conditions:  Endocrine crisis, metabolic crisis and dehydration   Critical care was time spent personally by me on the following activities:  Development of treatment plan with patient or surrogate, discussions with consultants, evaluation of patient's response to treatment, examination of patient, ordering and review of laboratory studies, ordering and review of radiographic studies, ordering and performing treatments and interventions, pulse oximetry, re-evaluation of patient's condition and review of old charts   Medications  insulin  regular, human (MYXREDLIN ) 100 units/ 100 mL infusion (has no administration in time range)  lactated ringers  infusion (has no administration in time range)  dextrose  5 % in lactated ringers  infusion (0 mLs Intravenous Stopped 09/26/23 1946)  dextrose  50 % solution 0-50 mL (has no administration in time range)  potassium chloride  10 mEq in 100 mL IVPB (has no administration in time range)  lactated ringers  bolus 1,000 mL (1,000 mLs Intravenous New Bag/Given 09/26/23 1856)   ondansetron  (ZOFRAN ) injection 4 mg (4 mg Intravenous Given 09/26/23 1931)  lactated ringers  bolus 1,000 mL (1,000 mLs Intravenous New Bag/Given 09/26/23 1941)     IMPRESSION / MDM / ASSESSMENT AND PLAN / ED COURSE  I reviewed the triage vital signs and the nursing notes.  Differential diagnosis includes, but is not limited to, DKA, HHS, AKI, pancreatitis  {Patient presents with symptoms of an acute illness or injury that is potentially life-threatening.  Patient presents with signs of DKA requiring insulin  drip and medical admission.  Presents unwell appearing, soft pressures and tachycardic, improving with IV fluids.  Start insulin  drip, anion gap of 16, renal dysfunction near baseline with a glucose of 470.  Normal CBC.  Awaiting beta-hydroxybutyrate gas and venous blood gas.  Consult with medicine for admission.  Clinical Course as of 09/26/23 1955  Wed Sep 26, 2023  1951 Reassessed and discussed signs of mild DKA, medical admission. [DS]    Clinical Course User Index [DS] Claudene Rover, MD     FINAL CLINICAL IMPRESSION(S) / ED DIAGNOSES   Final diagnoses:  Diabetic ketoacidosis without coma associated with type 1 diabetes mellitus (HCC)     Rx / DC Orders   ED Discharge Orders     None        Note:  This document was prepared using Dragon voice recognition software and may include unintentional dictation errors.   Claudene Rover, MD 09/26/23 [phone]

## 2023-09-26 NOTE — H&P (Signed)
 [place]   PATIENT NAME: James [name]    MR#:  [id number]  DATE OF BIRTH:  May 03, 1980  DATE OF ADMISSION:  09/26/2023  PRIMARY CARE PHYSICIAN: Charlott Dorn LABOR, MD   Patient is coming from: Home  REQUESTING/REFERRING PHYSICIAN: Claudene Rover, MD  CHIEF COMPLAINT:   Chief Complaint  Patient presents with   Hyperglycemia    HISTORY OF PRESENT ILLNESS:  James [name] is a 43 y.o. male with medical history significant for type 1 diabetes mellitus, presented to the emergency room with acute onset of intractable nausea and vomiting since this morning with no bilious vomitus or hematemesis.  He admitted to moderate diarrhea associated with his nausea and vomiting.  He has been having polyuria and polydipsia.  He denied any abdominal pain.  No fever or chills.  No chest pain or palpitations.  No cough or wheezing or dyspnea.  No dysuria, oliguria or hematuria or flank pain.  No bleeding diathesis.  ED Course: When he came to the ER, BP was 86/73 and later 115/80 with otherwise normal vital signs.  Labs revealed a blood glucose of 471 with anion gap of 16 however with CO2 of 22 and BUN of 53 creatinine 2.66 above previous levels from June of this year and AST 59 ALT 124 with total protein of 8.8 and albumin 4.3.  CBC was unremarkable.  Blood glucose level was 258.  Beta hydroxybutyrate 0.11 EKG as reviewed by me : None. Imaging: None.  The patient was given 4 mg of IV Zofran  and 1 L bolus of IV normal saline and 10 mg an IV potassium chloride .  He was started on IV insulin  drip initially.  He will be admitted to a progressive unit observation bed for further evaluation and management. PAST MEDICAL HISTORY:   Past Medical History:  Diagnosis Date   Diabetes mellitus without complication (HCC)     PAST SURGICAL HISTORY:   Past Surgical History:  Procedure Laterality Date   AMPUTATION TOE Left 03/13/2021   Procedure: AMPUTATION BIG TOE;  Surgeon: Tobie Franky SQUIBB, DPM;   Location: [facility] ORS;  Service: Podiatry;  Laterality: Left;   COCHLEAR IMPLANT  05/27/2020   [facility]    SOCIAL HISTORY:   Social History   Tobacco Use   Smoking status: Never   Smokeless tobacco: Never  Substance Use Topics   Alcohol use: Never    FAMILY HISTORY:   Family History  Problem Relation Age of Onset   Diabetes Father     DRUG ALLERGIES:  No Known Allergies  REVIEW OF SYSTEMS:   ROS As per history of present illness. All pertinent systems were reviewed above. Constitutional, HEENT, cardiovascular, respiratory, GI, GU, musculoskeletal, neuro, psychiatric, endocrine, integumentary and hematologic systems were reviewed and are otherwise negative/unremarkable except for positive findings mentioned above in the HPI.   MEDICATIONS AT HOME:   Prior to Admission medications   Medication Sig Start Date End Date Taking? Authorizing Provider  acetaminophen  (TYLENOL ) 325 MG tablet Take 2 tablets (650 mg total) by mouth every 6 (six) hours as needed for mild pain (or Fever >/= 101). 03/14/21  Yes Wieting, Richard, MD  atorvastatin (LIPITOR) 80 MG tablet Take 80 mg by mouth daily.   Yes [provider]  cholecalciferol  (VITAMIN D3) 25 MCG (1000 UNIT) tablet Take 1,000 Units by mouth daily.   Yes [provider]  ferrous gluconate  (FERGON) 324 MG tablet Take 324 mg by mouth. Take 1 tablet by mouth 3 (three)  times a week 03/23/22  Yes [provider]  insulin  aspart (NOVOLOG ) 100 UNIT/ML injection Max daily 30 units  per pump 03/28/23  Yes Shamleffer, Ibtehal Jaralla, MD  losartan  (COZAAR ) 25 MG tablet Take 1 tablet (25 mg total) by mouth daily. 03/28/23 03/27/24 Yes Shamleffer, Ibtehal Jaralla, MD  rosuvastatin  (CRESTOR ) 40 MG tablet Take 1 tablet (40 mg total) by mouth daily. 07/02/23  Yes Shamleffer, Ibtehal Jaralla, MD  silver  sulfADIAZINE  (SILVADENE ) 1 % cream Apply 1 Application topically daily. 07/10/23  Yes Tobie Franky SQUIBB, DPM  Blood Glucose Monitoring Suppl  (ONETOUCH VERIO FLEX SYSTEM) w/Device KIT SMARTSIG:device 1-3 Times Daily    [provider]  Continuous Glucose Sensor (DEXCOM G7 SENSOR) MISC 1 Device by Does not apply route as directed. 03/28/23   Shamleffer, Ibtehal Jaralla, MD  Insulin  Infusion Pump (T:SLIM X2 CONTROL-IQ 7.8 PUMP) DEVI USE AS DIRECTED 04/02/23   Shamleffer, Donell Cardinal, MD  Insulin  Infusion Pump Supplies (T:SLIM X2 CARTRIDGE) MISC CHANGE EVERY TWO (2) DAYS 04/02/23   Shamleffer, Ibtehal Jaralla, MD  Insulin  Infusion Pump Supplies (TRUSTEEL INFUSION SET) MISC CHANGE EVERY TWO (2) DAYS 04/02/23   Shamleffer, Ibtehal Jaralla, MD  midodrine (PROAMATINE) 5 MG tablet 2 tablets Orally twice daily for 30 day(s) Patient not taking: Reported on 09/26/2023    [provider]  omeprazole (PRILOSEC) 40 MG capsule 1 capsule. Patient not taking: Reported on 09/26/2023    [provider]  ondansetron  (ZOFRAN -ODT) 4 MG disintegrating tablet Take 1 tablet (4 mg total) by mouth every 8 (eight) hours as needed for nausea or vomiting. Patient not taking: Reported on 09/26/2023 07/07/23   Herlinda Belton B, FNP  oxyCODONE  (OXY IR/ROXICODONE ) 5 MG immediate release tablet Take 5 mg by mouth every 4 (four) hours as needed. Patient not taking: Reported on 09/26/2023 09/18/23   [provider]  pantoprazole  (PROTONIX ) 40 MG tablet Take 40 mg by mouth daily. Patient not taking: Reported on 09/26/2023 02/20/23   [provider]  tamsulosin (FLOMAX) 0.4 MG CAPS capsule 1 capsule. Patient not taking: Reported on 09/26/2023    [provider]      VITAL SIGNS:  Blood pressure (!) 86/73, pulse (!) 114, resp. rate (!) 26, SpO2 100%.  PHYSICAL EXAMINATION:  Physical Exam  GENERAL:  43 y.o.-year-old patient lying in the bed with no acute distress.  EYES: Pupils equal, round, reactive to light and accommodation. No scleral icterus. Extraocular muscles intact.  HEENT: Head atraumatic, normocephalic. Oropharynx  and nasopharynx clear.  NECK:  Supple, no jugular venous distention. No thyroid enlargement, no tenderness.  LUNGS: Normal breath sounds bilaterally, no wheezing, rales,rhonchi or crepitation. No use of accessory muscles of respiration.  CARDIOVASCULAR: Regular rate and rhythm, S1, S2 normal. No murmurs, rubs, or gallops.  ABDOMEN: Soft, nondistended, with mild epigastric tenderness without rebound/guarding or rigidity.. Bowel sounds present. No organomegaly or mass.  EXTREMITIES: No pedal edema, cyanosis, or clubbing.  NEUROLOGIC: Cranial nerves II through XII are intact. Muscle strength 5/5 in all extremities. Sensation intact. Gait not checked.  PSYCHIATRIC: The patient is alert and oriented x 3.  Normal affect and good eye contact. SKIN: No obvious rash, lesion, or ulcer.   LABORATORY PANEL:   CBC Recent Labs  Lab 09/26/23 1841  WBC 8.7  HGB 13.2  HCT 41.2  PLT 204   ------------------------------------------------------------------------------------------------------------------  Chemistries  Recent Labs  Lab 09/26/23 1841 09/26/23 2053  NA 140 143  K 4.2 4.6  CL 102 105  CO2 22  25  GLUCOSE 471* 258*  BUN 53* 52*  CREATININE 2.66* 2.21*  CALCIUM  10.1 9.7  MG  --  2.5*  AST 59*  --   ALT 124*  --   ALKPHOS 124  --   BILITOT 0.8  --    ------------------------------------------------------------------------------------------------------------------  Cardiac Enzymes No results for input(s): TROPONINI in the last 168 hours. ------------------------------------------------------------------------------------------------------------------  RADIOLOGY:  No results found.    IMPRESSION AND PLAN:  Assessment and Plan: * Uncontrolled type 1 diabetes mellitus with hyperglycemia, with long-term current use of insulin  (HCC) - The patient will be admitted to the progressive unit observation bed. - Will continue aggressive hydration with IV normal saline. - Will place  the patient on highly resistant NovoLog  subcutaneously and will have him use his insulin  pump soon with better blood glucose measures and closure of his anion gap. - Will replace potassium. - This is clearly secondary to his recurrent nausea and vomiting.  Acute kidney injury superimposed on chronic kidney disease (HCC) - The patient will be hydrated with IV normal saline and. - Will hold nephrotoxins. - Will follow BMP levels.  Intractable nausea and vomiting - The patient will be placed on scheduled IV Reglan  and as needed IV Zofran . - Will place him on hydration as mentioned above. - IV PPI therapy will be provided.  Dyslipidemia - Will hold off statin therapy and follow LFTs with hydration.  History of anemia - Continue ferrous gluconate .  BPH (benign prostatic hyperplasia) - Continue Flomax.   DVT prophylaxis: Lovenox . Advanced Care Planning:  Code Status: full code. Family Communication:  The plan of care was discussed in details with the patient (and family). I answered all questions. The patient agreed to proceed with the above mentioned plan. Further management will depend upon hospital course. Disposition Plan: Back to previous home environment Consults called: none. All the records are reviewed and case discussed with ED provider.  Status is: Observation  I certify that at the time of admission, it is my clinical judgment that the patient will require  hospital care extending less than 2 midnights.                            Dispo: The patient is from: Home              Anticipated d/c is to: Home              Patient currently is not medically stable to d/c.              Difficult to place patient: No  Madison DELENA Peaches M.D on 09/26/2023 at 10:57 PM  [facility]   From 7 PM-7 AM, contact night-coverage [email or web address]  CC: Primary care physician; Charlott Dorn DELENA, MD

## 2023-09-26 NOTE — Assessment & Plan Note (Signed)
-   The patient will be hydrated with IV normal saline and. - Will hold nephrotoxins. - Will follow BMP levels.

## 2023-09-26 NOTE — Assessment & Plan Note (Signed)
 Continue ferrous gluconate

## 2023-09-26 NOTE — ED Triage Notes (Addendum)
 Pt here with n/v and abdominal pain since this morning. Pt had IDDM and has been admitted here multiple times for DKA. Pt has not taken his blood sugar today. Pt took total of 20-something of insulin  today. Pt insulin  pump sensor had expired but he got the replacement changed out 1 hour ago. Pt actively vomiting in triage.

## 2023-09-26 NOTE — Assessment & Plan Note (Signed)
-   The patient will be admitted to the progressive unit observation bed. - Will continue aggressive hydration with IV normal saline. - Will place the patient on highly resistant NovoLog  subcutaneously and will have him use his insulin  pump soon with better blood glucose measures and closure of his anion gap. - Will replace potassium. - This is clearly secondary to his recurrent nausea and vomiting.

## 2023-09-26 NOTE — Assessment & Plan Note (Signed)
-   The patient will be placed on scheduled IV Reglan  and as needed IV Zofran . - Will place him on hydration as mentioned above. - IV PPI therapy will be provided.

## 2023-09-27 DIAGNOSIS — N400 Enlarged prostate without lower urinary tract symptoms: Secondary | ICD-10-CM

## 2023-09-27 DIAGNOSIS — N179 Acute kidney failure, unspecified: Secondary | ICD-10-CM | POA: Diagnosis not present

## 2023-09-27 DIAGNOSIS — R112 Nausea with vomiting, unspecified: Secondary | ICD-10-CM | POA: Diagnosis not present

## 2023-09-27 DIAGNOSIS — E1065 Type 1 diabetes mellitus with hyperglycemia: Secondary | ICD-10-CM | POA: Diagnosis not present

## 2023-09-27 DIAGNOSIS — E1010 Type 1 diabetes mellitus with ketoacidosis without coma: Secondary | ICD-10-CM

## 2023-09-27 LAB — BASIC METABOLIC PANEL WITH GFR
Anion gap: 7 (ref 5–15)
Anion gap: 10 (ref 5–15)
BUN: 43 mg/dL — ABNORMAL HIGH (ref 6–20)
BUN: 37 mg/dL — ABNORMAL HIGH (ref 6–20)
CO2: 26 mmol/L (ref 22–32)
CO2: 26 mmol/L (ref 22–32)
Calcium: 8.9 mg/dL (ref 8.9–10.3)
Calcium: 8.8 mg/dL — ABNORMAL LOW (ref 8.9–10.3)
Chloride: 111 mmol/L (ref 98–111)
Chloride: 103 mmol/L (ref 98–111)
Creatinine, Ser: 1.89 mg/dL — ABNORMAL HIGH (ref 0.61–1.24)
Creatinine, Ser: 1.77 mg/dL — ABNORMAL HIGH (ref 0.61–1.24)
GFR, Estimated: 45 mL/min — ABNORMAL LOW (ref >60)
GFR, Estimated: 48 mL/min — ABNORMAL LOW (ref >60)
Glucose, Bld: 144 mg/dL — ABNORMAL HIGH (ref 70–99)
Glucose, Bld: 257 mg/dL — ABNORMAL HIGH (ref 70–99)
Potassium: 3.7 mmol/L (ref 3.5–5.1)
Potassium: 3.5 mmol/L (ref 3.5–5.1)
Sodium: 144 mmol/L (ref 135–145)
Sodium: 139 mmol/L (ref 135–145)

## 2023-09-27 LAB — BLOOD GAS, VENOUS
Acid-Base Excess: 1.5 mmol/L (ref 0.0–2.0)
Bicarbonate: 27.7 mmol/L (ref 20.0–28.0)
O2 Saturation: 92.2 %
Patient temperature: 37
pCO2, Ven: 49 mmHg (ref 44–60)
pH, Ven: 7.36 (ref 7.25–7.43)
pO2, Ven: 59 mmHg — ABNORMAL HIGH (ref 32–45)

## 2023-09-27 LAB — CBC
HCT: 33.7 % — ABNORMAL LOW (ref 39.0–52.0)
Hemoglobin: 10.6 g/dL — ABNORMAL LOW (ref 13.0–17.0)
MCH: 25.9 pg — ABNORMAL LOW (ref 26.0–34.0)
MCHC: 31.5 g/dL (ref 30.0–36.0)
MCV: 82.4 fL (ref 80.0–100.0)
Platelets: 146 K/uL — ABNORMAL LOW (ref 150–400)
RBC: 4.09 MIL/uL — ABNORMAL LOW (ref 4.22–5.81)
RDW: 13.6 % (ref 11.5–15.5)
WBC: 8.8 K/uL (ref 4.0–10.5)
nRBC: 0 % (ref 0.0–0.2)

## 2023-09-27 LAB — CBG MONITORING, ED
Glucose-Capillary: 154 mg/dL — ABNORMAL HIGH (ref 70–99)
Glucose-Capillary: 106 mg/dL — ABNORMAL HIGH (ref 70–99)
Glucose-Capillary: 398 mg/dL — ABNORMAL HIGH (ref 70–99)
Glucose-Capillary: 311 mg/dL — ABNORMAL HIGH (ref 70–99)
Glucose-Capillary: 300 mg/dL — ABNORMAL HIGH (ref 70–99)
Glucose-Capillary: 178 mg/dL — ABNORMAL HIGH (ref 70–99)

## 2023-09-27 MED ORDER — INSULIN ASPART 100 UNIT/ML IJ SOLN: 0.0000 [IU] | Freq: Every day | INTRAMUSCULAR | Status: DC

## 2023-09-27 MED ORDER — INSULIN ASPART 100 UNIT/ML IJ SOLN
7.0000 [IU] | Freq: Once | INTRAMUSCULAR | Status: AC
  Administered 2023-09-27: 7 [IU] via SUBCUTANEOUS
  Filled 2023-09-27: qty 1

## 2023-09-27 MED ORDER — INSULIN ASPART 100 UNIT/ML IJ SOLN
10.0000 [IU] | Freq: Once | INTRAMUSCULAR | Status: AC
  Administered 2023-09-27: 10 [IU] via SUBCUTANEOUS
  Filled 2023-09-27: qty 1

## 2023-09-27 MED ORDER — INSULIN ASPART 100 UNIT/ML IJ SOLN
5.0000 [IU] | Freq: Three times a day (TID) | INTRAMUSCULAR | Status: DC
  Administered 2023-09-27: 5 [IU] via SUBCUTANEOUS
  Filled 2023-09-27: qty 1

## 2023-09-27 MED ORDER — METOCLOPRAMIDE HCL 10 MG PO TABS: 10.0000 mg | ORAL_TABLET | Freq: Three times a day (TID) | ORAL | 1 refills | Status: AC | PRN

## 2023-09-27 MED ORDER — INSULIN ASPART 100 UNIT/ML IJ SOLN
0.0000 [IU] | Freq: Three times a day (TID) | INTRAMUSCULAR | Status: DC
  Administered 2023-09-27: 2 [IU] via SUBCUTANEOUS
  Filled 2023-09-27: qty 1

## 2023-09-27 NOTE — ED Notes (Signed)
 Pt refusing lunch at this time. CBG recheck was 311. MD order for 10 units of insulin  now and recheck CBG in one hour.

## 2023-09-27 NOTE — ED Notes (Signed)
 Per Dr. Caleen, pt okay to leave after eating dinner

## 2023-09-27 NOTE — Inpatient Diabetes Management (Addendum)
 Inpatient Diabetes Program Recommendations  AACE/ADA: New Consensus Statement on Inpatient Glycemic Control   Target Ranges:  Prepandial:   less than 140 mg/dL      Peak postprandial:   less than 180 mg/dL (1-2 hours)      Critically ill patients:  140 - 180 mg/dL    Latest Reference Range & Units 09/27/23 11:02 09/27/23 13:29 09/27/23 14:39  Glucose-Capillary 70 - 99 mg/dL 601 (H) 688 (H) 699 (H)    Latest Reference Range & Units 09/26/23 18:40 09/26/23 20:05 09/26/23 20:43 09/26/23 21:25 09/26/23 22:48 09/27/23 04:47  Glucose-Capillary 70 - 99 mg/dL 526 (H) 714 (H) 731 (H) 225 (H) 154 (H) 106 (H)    Latest Reference Range & Units 09/26/23 18:41 09/26/23 20:53 09/27/23 04:49  CO2 22 - 32 mmol/L 22 25 26   Glucose 70 - 99 mg/dL 528 (H) 741 (H) 855 (H)  BUN 6 - 20 mg/dL 53 (H) 52 (H) 43 (H)  Creatinine 0.61 - 1.24 mg/dL 7.33 (H) 7.78 (H) 8.10 (H)  Calcium  8.9 - 10.3 mg/dL 89.8 9.7 8.9  Anion gap 5 - 15  16 (H) 13 7    Latest Reference Range & Units 09/26/23 20:53  Beta-Hydroxybutyric Acid 0.05 - 0.27 mmol/L 0.11   Review of Glycemic Control  Diabetes history: DM1 Outpatient Diabetes medications: [employer] T-Slim insulin  pump with Novolog , Dexcom G7 CGM Current orders for Inpatient glycemic control: Insulin  Pump   Inpatient Diabetes Program Recommendations:    Insulin : Insulin  pump stopped delivering insulin  around 5am today so infusion site was removed and patient will receive SQ insulin . Recommended patient receive Novolog  7 units x1 now for correction of CBG of 398 mg/dl.  If patient remains inpatient today, please order insulin  glargine 15 units Q24H, Novolog  0-9 units TID with meals, Novolog  0-5 units at bedtime, and Novolog  4 units TID with meals for meal coverage if patient eats at least 50% of meals.  NOTE: Patient currently in ED. Per chart review patient has Type 1 DM and uses an insulin  pump for DM control. Patient sees Dr. Sam (Endocrinologist) and was last seen  07/02/23. Per office note on 07/02/23 insulin  pump settings should be:  Basal 12A     0.710 6A       0.85 5P       0.800 9:30P  0.810 Total Basal: 19:235 units/24 hours  Insulin  to Carb Ratio 1:1 gram (to enter 8 grams with meals and 4 grams with snacks)  Insulin  Sensitivity 12A  1:45 mg/dl 6A    8:59 mg/dl 5P     8:59 mg/dl 0:69E 8:54 mg/dl  Target Glucose: 889 mg/dl  Talked with patient at bedside in ED regarding DM control and insulin  pump. Patient reports that his insulin  pump is attached but his pump stopped insulin  pump delivery. Patient reports he is feeling very thirsty. Current CGM reading HIGH.  Asked RN to check finger stick glucose and resulted with CBG of 398 mg/dl.  Reviewed insulin  pump settings are they are exactly as noted above. Per insulin  pump history, the insulin  pump stopped insulin  delivery around 5am today. Patient reports that he put the current infusion site on yesterday. Patient states that he has extra pump supplies at home but none here at the hospital. Explained that since the insulin  pump stopped delivery and glucose went back up to 398 mg/dl then we need to remove the current infusion site to see if the site itself is kinked. Explained that if site is removed then SQ insulin   would be ordered and he would receive SQ insulin  while inpatient and then he could resume his insulin  pump at home when he can start a new infusion site. Dr. Caleen at bedside and discussed patient's CBG back up to 398 mg/dl due to insulin  pump stopping delivery. Dr. Caleen okay with patient removing insulin  pump infusion site and getting Novolog  SQ. Had patient remove insulin  pump infusion site and site itself looked ok but patient did have it in a hardened scar tissue area. Discussed not using the same sites for his infusion site and discussed importance of rotating sites and staying away from scar tissue.  Asked patient to call 1-800 number for [employer] to have their customer service trouble shoot the  pump and make sure there is not an issue with the pump itself.  Patient states he will call to make sure the pump is working properly.  Patient states he has plenty of pump supplies at home, he has CGM sensors, Novolog  insulin  vials, and insulin  syringes at home. Dr. Caleen notes that patient will be given Novolog  as recommended and if glucose improves and patient feels better, he may be discharged in a few hours.  If patient is not discharged, he will need orders for basal, correction, and meal coverage insulin  while inpatient.  Patient verbalized understanding and has no questions at this time.  Addendum 09/27/23@15 :00-Sent chat message to Dr. Caleen and Laneta, RN regarding patient's glucose still 300 mg/dl at 85:60 despite patient receiving a total of Novolog  17 units (7 units at 11:36 and Novolog  10 units at 13:40). Recommended checking BMET to ensure patient is not in DKA. If in DKA, he would need IV insulin .   If patient is not in DKA, would recommend to order Lantus  15 units Q24H and keep patient overnight.   Thanks, Earnie Gainer, RN, MSN, CDCES Diabetes Coordinator Inpatient Diabetes Program [phone] (Team Pager from 8am to 5pm)

## 2023-09-27 NOTE — ED Notes (Signed)
 Confirmed with Dr. Caleen to give pt the ordered 7 units of novolog  with meal.

## 2023-09-27 NOTE — Hospital Course (Addendum)
 Partly taken from H&P.  James [name] is a 43 y.o. male with medical history significant for type 1 diabetes mellitus, presented to the emergency room with acute onset of intractable nausea and vomiting since this morning, no hematemesis. Patient also has some diarrhea, polyuria and polydipsia.  No abdominal pain.  On presentation patient has borderline soft blood pressure at 86/73, labs with blood glucose of 471, anion gap of 16, CO2 22, BUN 53, creatinine of 2.66, AST 59, ALT 124, beta-hydroxybutyrate 0.11. CBG improved and gap closed with insulin  infusion.  9/4: Vital normal, creatinine improved to 1.89, hemoglobin at 10.6 and platelets of 146.  Nausea, vomiting and diarrhea resolved.  Patient was able to tolerate diet.  He was transition off from insulin  infusion, found to have malfunctioning pump so he was given subcutaneous NovoLog .  Patient did receive some extra doses of NovoLog  as CBG started trending up and was in 300s.  Repeat BMP with no concern of DKA. CBG improved.  Patient was advised to restart his insulin  pump at a location where there is no underlying pump as it will interfere with insulin  delivery and absorption.  He was also given some Reglan  to use as needed for nausea and vomiting.  Patient likely had some viral gastroenteritis which has been improved.  He will continue with his home medications and need to have a close follow-up with his providers for further assistance.

## 2023-09-27 NOTE — ED Notes (Signed)
 Per Dr. Caleen, we will recheck pt's BMP and if okay, pt will possibly be discharged. BMP collected and sent to lab.

## 2023-09-27 NOTE — Discharge Summary (Signed)
 Physician Discharge Summary   Patient: James [name] MRN: [id number] DOB: 05-20-1980  Admit date:     09/26/2023  Discharge date: 09/27/23  Discharge Physician: Amaryllis Dare   PCP: Charlott Dorn LABOR, MD   Recommendations at discharge:  Please obtain CBC and BMP on follow-up Follow-up with primary care provider and endocrinologist  Discharge Diagnoses: Principal Problem:   Uncontrolled type 1 diabetes mellitus with hyperglycemia, with long-term current use of insulin  (HCC) Active Problems:   Intractable nausea and vomiting   Acute kidney injury superimposed on chronic kidney disease (HCC)   Dyslipidemia   BPH (benign prostatic hyperplasia)   History of anemia   Hospital Course: Partly taken from H&P.  James [name] is a 43 y.o. male with medical history significant for type 1 diabetes mellitus, presented to the emergency room with acute onset of intractable nausea and vomiting since this morning, no hematemesis. Patient also has some diarrhea, polyuria and polydipsia.  No abdominal pain.  On presentation patient has borderline soft blood pressure at 86/73, labs with blood glucose of 471, anion gap of 16, CO2 22, BUN 53, creatinine of 2.66, AST 59, ALT 124, beta-hydroxybutyrate 0.11. CBG improved and gap closed with insulin  infusion.  9/4: Vital normal, creatinine improved to 1.89, hemoglobin at 10.6 and platelets of 146.  Nausea, vomiting and diarrhea resolved.  Patient was able to tolerate diet.  He was transition off from insulin  infusion, found to have malfunctioning pump so he was given subcutaneous NovoLog .  Patient did receive some extra doses of NovoLog  as CBG started trending up and was in 300s.  Repeat BMP with no concern of DKA. CBG improved.  Patient was advised to restart his insulin  pump at a location where there is no underlying pump as it will interfere with insulin  delivery and absorption.  He was also given some Reglan  to use as needed for nausea and  vomiting.  Patient likely had some viral gastroenteritis which has been improved.  He will continue with his home medications and need to have a close follow-up with his providers for further assistance.  Consultants: None Procedures performed: None Disposition: Home Diet recommendation:  Discharge Diet Orders (From admission, onward)     Start     Ordered   09/27/23 0000  Diet - low sodium heart healthy        09/27/23 1700           Carb modified diet DISCHARGE MEDICATION: Allergies as of 09/27/2023   No Known Allergies      Medication List     STOP taking these medications    atorvastatin 80 MG tablet Commonly known as: LIPITOR   midodrine 5 MG tablet Commonly known as: PROAMATINE   ondansetron  4 MG disintegrating tablet Commonly known as: ZOFRAN -ODT   oxyCODONE  5 MG immediate release tablet Commonly known as: Oxy IR/ROXICODONE    pantoprazole  40 MG tablet Commonly known as: PROTONIX        TAKE these medications    acetaminophen  325 MG tablet Commonly known as: TYLENOL  Take 2 tablets (650 mg total) by mouth every 6 (six) hours as needed for mild pain (or Fever >/= 101).   cholecalciferol  25 MCG (1000 UNIT) tablet Commonly known as: VITAMIN D3 Take 1,000 Units by mouth daily.   Dexcom G7 Sensor Misc 1 Device by Does not apply route as directed.   ferrous gluconate  324 MG tablet Commonly known as: FERGON Take 324 mg by mouth. Take 1 tablet by mouth 3 (three) times a week  insulin  aspart 100 UNIT/ML injection Commonly known as: novoLOG  Max daily 30 units  per pump   losartan  25 MG tablet Commonly known as: COZAAR  Take 1 tablet (25 mg total) by mouth daily.   metoCLOPramide  10 MG tablet Commonly known as: REGLAN  Take 1 tablet (10 mg total) by mouth every 8 (eight) hours as needed for nausea or vomiting.   omeprazole 40 MG capsule Commonly known as: PRILOSEC 1 capsule.   OneTouch Verio Flex System w/Device Kit SMARTSIG:device 1-3 Times  Daily   rosuvastatin  40 MG tablet Commonly known as: CRESTOR  Take 1 tablet (40 mg total) by mouth daily.   silver  sulfADIAZINE  1 % cream Commonly known as: Silvadene  Apply 1 Application topically daily.   T:slim X2 3mL Cartridge Misc CHANGE EVERY TWO (2) DAYS   TruSteel Infusion Set Misc CHANGE EVERY TWO (2) DAYS   T:slim X2 Control-IQ 7.8 Pump Devi USE AS DIRECTED   tamsulosin 0.4 MG Caps capsule Commonly known as: FLOMAX 1 capsule.        Follow-up Information     Charlott Dorn LABOR, MD. Schedule an appointment as soon as possible for a visit in 1 week(s).   Specialty: Internal Medicine Contact information: [address] [place] [address] [phone]                Discharge Exam: Fredricka Weights   09/27/23 1231  Weight: 73 kg   General.  Thin built gentleman, in no acute distress. Pulmonary.  Lungs clear bilaterally, normal respiratory effort. CV.  Regular rate and rhythm, no JVD, rub or murmur. Abdomen.  Soft, nontender, nondistended, BS positive. CNS.  Alert and oriented .  No focal neurologic deficit. Extremities.  No edema, no cyanosis, pulses intact and symmetrical. Psychiatry.  Judgment and insight appears normal.   Condition at discharge: stable  The results of significant diagnostics from this hospitalization (including imaging, microbiology, ancillary and laboratory) are listed below for reference.   Imaging Studies: No results found.  Microbiology: Results for orders placed or performed during the hospital encounter of 07/07/23  Resp panel by RT-PCR (RSV, Flu A&B, Covid) Anterior Nasal Swab     Status: None   Collection Time: 07/07/23 10:48 AM   Specimen: Anterior Nasal Swab  Result Value Ref Range Status   SARS Coronavirus 2 by RT PCR NEGATIVE NEGATIVE Final    Comment: (NOTE) SARS-CoV-2 target nucleic acids are NOT DETECTED.  The SARS-CoV-2 RNA is generally detectable in upper respiratory specimens during  the acute phase of infection. The lowest concentration of SARS-CoV-2 viral copies this assay can detect is 138 copies/mL. A negative result does not preclude SARS-Cov-2 infection and should not be used as the sole basis for treatment or other patient management decisions. A negative result may occur with  improper specimen collection/handling, submission of specimen other than nasopharyngeal swab, presence of viral mutation(s) within the areas targeted by this assay, and inadequate number of viral copies(<138 copies/mL). A negative result must be combined with clinical observations, patient history, and epidemiological information. The expected result is Negative.  Fact Sheet for Patients:  BloggerCourse.com  Fact Sheet for Healthcare Providers:  SeriousBroker.it  This test is no t yet approved or cleared by the [place]  FDA and  has been authorized for detection and/or diagnosis of SARS-CoV-2 by FDA under an Emergency Use Authorization (EUA). This EUA will remain  in effect (meaning this test can be used) for the duration of the COVID-19 declaration under Section 564(b)(1) of the  Act, 21 U.S.C.section 360bbb-3(b)(1), unless the authorization is terminated  or revoked sooner.       Influenza A by PCR NEGATIVE NEGATIVE Final   Influenza B by PCR NEGATIVE NEGATIVE Final    Comment: (NOTE) The Xpert Xpress SARS-CoV-2/FLU/RSV plus assay is intended as an aid in the diagnosis of influenza from Nasopharyngeal swab specimens and should not be used as a sole basis for treatment. Nasal washings and aspirates are unacceptable for Xpert Xpress SARS-CoV-2/FLU/RSV testing.  Fact Sheet for Patients: BloggerCourse.com  Fact Sheet for Healthcare Providers: SeriousBroker.it  This test is not yet approved or cleared by the [place]  FDA and has been authorized for detection and/or  diagnosis of SARS-CoV-2 by FDA under an Emergency Use Authorization (EUA). This EUA will remain in effect (meaning this test can be used) for the duration of the COVID-19 declaration under Section 564(b)(1) of the Act, 21 U.S.C. section 360bbb-3(b)(1), unless the authorization is terminated or revoked.     Resp Syncytial Virus by PCR NEGATIVE NEGATIVE Final    Comment: (NOTE) Fact Sheet for Patients: BloggerCourse.com  Fact Sheet for Healthcare Providers: SeriousBroker.it  This test is not yet approved or cleared by the [place]  FDA and has been authorized for detection and/or diagnosis of SARS-CoV-2 by FDA under an Emergency Use Authorization (EUA). This EUA will remain in effect (meaning this test can be used) for the duration of the COVID-19 declaration under Section 564(b)(1) of the Act, 21 U.S.C. section 360bbb-3(b)(1), unless the authorization is terminated or revoked.  Performed at [facility], [address] [place], [place] [address]     Labs: CBC: Recent Labs  Lab 09/26/23 1841 09/27/23 0449  WBC 8.7 8.8  NEUTROABS 7.7  --   HGB 13.2 10.6*  HCT 41.2 33.7*  MCV 81.6 82.4  PLT 204 146*   Basic Metabolic Panel: Recent Labs  Lab 09/26/23 1841 09/26/23 2053 09/27/23 0449 09/27/23 1511  NA 140 143 144 139  K 4.2 4.6 3.7 3.5  CL 102 105 111 103  CO2 22 25 26 26   GLUCOSE 471* 258* 144* 257*  BUN 53* 52* 43* 37*  CREATININE 2.66* 2.21* 1.89* 1.77*  CALCIUM  10.1 9.7 8.9 8.8*  MG  --  2.5*  --   --    Liver Function Tests: Recent Labs  Lab 09/26/23 1841  AST 59*  ALT 124*  ALKPHOS 124  BILITOT 0.8  PROT 8.8*  ALBUMIN 4.3   CBG: Recent Labs  Lab 09/27/23 0447 09/27/23 1102 09/27/23 1329 09/27/23 1439 09/27/23 1615  GLUCAP 106* 398* 311* 300* 178*    Discharge time spent: greater than 30 minutes.  This record has been created using Conservation officer, historic buildings. Errors  have been sought and corrected,but may not always be located. Such creation errors do not reflect on the standard of care.   Signed: Amaryllis Dare, MD [facility] 09/27/2023

## 2023-09-27 NOTE — ED Notes (Signed)
 Per Dr. Caleen, we will administer the 7 units of Novolog  now, recheck CBG in 1-2 hours, and then re-dose if CBG not improved.

## 2023-10-02 ENCOUNTER — Ambulatory Visit: Admitting: Internal Medicine

## 2023-10-02 NOTE — Progress Notes (Deleted)
 Name: James [name]  MRN/ DOB: [id number], 11/07/1980   Age/ Sex: 43 y.o., male    PCP: Charlott Dorn LABOR, MD   Reason for Endocrinology Evaluation: Type 1 Diabetes Mellitus     Date of Initial Endocrinology Visit: 12/21/2021    PATIENT IDENTIFIER: James [name] is a 43 y.o. male with a past medical history of T1DM. The patient presented for initial endocrinology clinic visit on 12/21/2021 for consultative assistance with his diabetes management.    HPI: Mr. Stockley was    Diagnosed with DM at age 16 Prior Medications tried/Intolerance: has been on the pump for years  Hemoglobin A1c has ranged from 8.3% in 2022, peaking at 9.2% in 2023.    SUBJECTIVE:   During the last visit (07/02/2023): 10.4%  Today (10/02/23): James [name] is here for follow-up on diabetes management He checks his  blood sugars multiple  times daily. The patient has not had hypoglycemic episodes since the last clinic visit. The patient is  symptomatic with these episodes.  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 During the visit the pump basal and bolus doses were reviewed including carb/insulin  rations and supplemental doses. The clinical list was updated. The glucose meter download was reviewed in detail to determine if the current pump settings are providing the best glycemic control without excessive hypoglycemia.    Pump and meter download:     Pump   Tandem   Settings   Insulin  type   Novolog     Basal rate       0000 0.710 u/h    0600 0.85   1700 0.800   2130 0.810          I:C ratio       0000 1:1    Enter 8 g of carbohydrates with meals and 4 with snacks              Sensitivity       0000  45    0600 40   1700 40   2130 45      Goal       0000  110          Type & Model of Pump: Tandem  Insulin  Type: Currently using Novolog  .  There is no height or weight on file to calculate BMI.  PUMP STATISTICS: Average BG: 239 Average Daily Carbs (g): 0 Average Total Daily Insulin : 44.14 Average Daily Basal: 22.16(50 %) Average Daily Bolus: 21.98 (50 %)    HOME DIABETES REGIMEN: Novolog   Losartan  25 mg daily     Statin: yes ACE-I/ARB: no   CONTINUOUS GLUCOSE MONITORING RECORD INTERPRETATION    Dates of Recording: 5/27-07/02/2023  Sensor description:dexcom G7  Results statistics:   CGM use % of time 77  Average and SD 239/108  Time in range 37 %  % Time Above 180 26  % Time Above 250 37  % Time Below target 0.3   Glycemic patterns summary: BG's trend down overnight and between the meals  Hyperglycemic episodes postprandial  Hypoglycemic episodes occurred N/A  Overnight periods: Trends down to optimal   DIABETIC COMPLICATIONS: Microvascular complications:  S/P left great toe amputation, retinopathy B/L (  he is on injection)  Denies: neuropathy Last eye exam: Completed 11/28/ 2023  Macrovascular complications:   Denies: CAD, PVD, CVA   PAST HISTORY: Past Medical History:  Past Medical History:  Diagnosis Date   Diabetes mellitus without complication (HCC)    Past Surgical History:  Past Surgical History:  Procedure Laterality Date   AMPUTATION TOE Left 03/13/2021   Procedure: AMPUTATION BIG TOE;  Surgeon: Tobie Franky SQUIBB, DPM;  Location: [facility] ORS;  Service: Podiatry;  Laterality: Left;   COCHLEAR IMPLANT  05/27/2020   [facility]    Social History:  reports that he has never smoked. He has never used smokeless tobacco. He reports that he does not drink alcohol and does not use drugs. Family History:  Family History  Problem Relation Age of Onset   Diabetes Father      HOME MEDICATIONS: Allergies as of 10/02/2023   No Known Allergies       Medication List        Accurate as of October 02, 2023  7:11 AM. If you have any questions, ask your nurse or doctor.          acetaminophen  325 MG tablet Commonly known as: TYLENOL  Take 2 tablets (650 mg total) by mouth every 6 (six) hours as needed for mild pain (or Fever >/= 101).   cholecalciferol  25 MCG (1000 UNIT) tablet Commonly known as: VITAMIN D3 Take 1,000 Units by mouth daily.   Dexcom G7 Sensor Misc 1 Device by Does not apply route as directed.   ferrous gluconate  324 MG tablet Commonly known as: FERGON Take 324 mg by mouth. Take 1 tablet by mouth 3 (three) times a week   insulin  aspart 100 UNIT/ML injection Commonly known as: novoLOG  Max daily 30 units  per pump   losartan  25 MG tablet Commonly known as: COZAAR  Take 1 tablet (25 mg total) by mouth daily.   metoCLOPramide  10 MG tablet Commonly known as: REGLAN  Take 1 tablet (10 mg total) by mouth every 8 (eight) hours as needed for nausea or vomiting.   omeprazole 40 MG capsule Commonly known as: PRILOSEC 1 capsule.   OneTouch Verio Flex System w/Device Kit SMARTSIG:device 1-3 Times Daily   rosuvastatin  40 MG tablet Commonly known as: CRESTOR  Take 1 tablet (40 mg total) by mouth daily.   silver  sulfADIAZINE  1 % cream Commonly known as: Silvadene  Apply 1 Application topically daily.   T:slim X2 3mL Cartridge Misc CHANGE EVERY TWO (2) DAYS   TruSteel Infusion Set Misc CHANGE EVERY TWO (2) DAYS   T:slim X2 Control-IQ 7.8 Pump Devi USE AS DIRECTED   tamsulosin 0.4 MG Caps capsule Commonly known as: FLOMAX 1 capsule.         ALLERGIES: No Known Allergies   REVIEW OF SYSTEMS: A comprehensive ROS was conducted with the patient and is negative except as per HPI     OBJECTIVE:   VITAL SIGNS: There were no vitals taken for this visit.   PHYSICAL EXAM:  General: Pt appears well and is in NAD  Lungs: Clear with good BS bilat   Heart: RRR   Extremities: No pretibial edema   Neuro: MS is good with appropriate affect, pt is alert and Ox3    DM foot exam: 07/10/2023 per podiatry    DATA REVIEWED:  Lab Results  Component Value Date   HGBA1C 10.4 (A) 07/02/2023   HGBA1C 14.0 (H) 02/26/2023   HGBA1C 10.4 (H) 04/10/2022    Latest Reference Range & Units 09/27/23  15:11  Sodium 135 - 145 mmol/L 139  Potassium 3.5 - 5.1 mmol/L 3.5  Chloride 98 - 111 mmol/L 103  CO2 22 - 32 mmol/L 26  Glucose 70 - 99 mg/dL 742 (H)  BUN 6 - 20 mg/dL 37 (H)  Creatinine 9.38 - 1.24 mg/dL 8.22 (H)  Calcium  8.9 - 10.3 mg/dL 8.8 (L)  Anion gap 5 - 15  10  GFR, Estimated >60 mL/min 48 (L)  (H): Data is abnormally high (L): Data is abnormally low      Old records , labs and images have been reviewed.    ASSESSMENT / PLAN / RECOMMENDATIONS:   1) Type 1 Diabetes Mellitus, Poorly controlled, With neuropathic complications - Most recent A1c of 10.4%. Goal A1c < 7.0 %.    -Patient continues with hyperglycemia -I have referred him to our CDE for retraining on insulin  pump technology and to troubleshoot the pump, but they have not been able to reach him - Unfortunately, he continues not to bolus with meals, I did again advise the patient the importance of bolusing with meals and snacks to prevent postprandial hyperglycemia, we discussed the risk of CKD and other microvascular complications - I will decrease basal rates during the day to prevent hypoglycemia  Pump   Tandem   Settings   Insulin  type   Novolog     Basal rate       0000 0.710 u/h    0600 0.85   1700 0.800   2130 0.810          I:C ratio       0000 1:1    Enter 8 g of carbohydrates with meals and 4 with snacks              Sensitivity       0000  45   0600 40   1700 40   2130 45      Goal       0000  110          MEDICATIONS: NovoLog   EDUCATION / INSTRUCTIONS: BG monitoring instructions: Patient is instructed to check his blood sugars 3 times a day, before meals . Call [facility] if: BG persistently < 70  I reviewed the Rule of 15 for the treatment of hypoglycemia in detail with the patient. Literature supplied.   2) Diabetic complications:  Eye: Does  have known diabetic retinopathy.  Neuro/ Feet: Does not have known diabetic peripheral neuropathy. Renal: Patient does  have known baseline CKD. He is not on an ACEI/ARB at present.  3) Dyslipidemia :   - LDL acceptable  - Continue Rosuvastatin  40 mg daily   4)CKD III:  - His GFR continues to fluctuate, with recent diagnosis of AKI Medication  Continue losartan  25 mg daily   Follow-up in 3 months   Signed electronically by: Stefano Redgie Butts, MD  [facility]  [facility] [address] [place] [place], [place] [address] Phone: [phone] FAX: [phone]   CC: Charlott Dorn LABOR, MD [address] [place] [address] Phone: [phone]  Fax: [phone]    Return to Endocrinology clinic as below: Future Appointments  Date Time Provider Department Center  10/02/2023 10:50 AM Einer Meals, Donell Redgie, MD [facility] None

## 2023-10-05 ENCOUNTER — Other Ambulatory Visit: Payer: Self-pay | Admitting: Internal Medicine

## 2023-12-19 ENCOUNTER — Other Ambulatory Visit: Payer: Self-pay

## 2023-12-19 ENCOUNTER — Encounter: Payer: Self-pay | Admitting: Emergency Medicine

## 2023-12-19 ENCOUNTER — Emergency Department
Admission: EM | Admit: 2023-12-19 | Discharge: 2023-12-19 | Disposition: A | Discharge status: Home / Self Care | Attending: Emergency Medicine | Admitting: Emergency Medicine

## 2023-12-19 DIAGNOSIS — E1022 Type 1 diabetes mellitus with diabetic chronic kidney disease: Secondary | ICD-10-CM | POA: Insufficient documentation

## 2023-12-19 DIAGNOSIS — I129 Hypertensive chronic kidney disease with stage 1 through stage 4 chronic kidney disease, or unspecified chronic kidney disease: Secondary | ICD-10-CM | POA: Diagnosis not present

## 2023-12-19 DIAGNOSIS — R059 Cough, unspecified: Secondary | ICD-10-CM | POA: Diagnosis present

## 2023-12-19 DIAGNOSIS — N189 Chronic kidney disease, unspecified: Secondary | ICD-10-CM | POA: Insufficient documentation

## 2023-12-19 DIAGNOSIS — J069 Acute upper respiratory infection, unspecified: Secondary | ICD-10-CM | POA: Insufficient documentation

## 2023-12-19 LAB — GROUP A STREP BY PCR: Group A Strep by PCR: NOT DETECTED

## 2023-12-19 LAB — RESP PANEL BY RT-PCR (RSV, FLU A&B, COVID)  RVPGX2
Influenza A by PCR: NEGATIVE
Influenza B by PCR: NEGATIVE
Resp Syncytial Virus by PCR: NEGATIVE
SARS Coronavirus 2 by RT PCR: NEGATIVE

## 2023-12-19 NOTE — ED Triage Notes (Signed)
 Pt arrived via POV with reports of 3 days of allergy like sxs, runny nose, sore throat, cough, states he is concerned about having the flu.

## 2023-12-19 NOTE — Discharge Instructions (Signed)
 Follow up with primary care if not improving over the next few days.  Take tylenol  or ibuprofen for pain.  Return to the ER for symptoms that change or worsen if unable to see primary care.

## 2023-12-20 NOTE — ED Provider Notes (Signed)
   [facility] Provider Note    Event Date/Time   First MD Initiated Contact with Patient 12/19/23 [phone]     (approximate)   History   Nasal Congestion and Sore Throat   HPI  James [name] is a 43 y.o. male with history of CKD, type 1 diabetic, hyperlipidemia, HTN and as listed in EMR presents to the emergency department for treatment and evaluation of rhinorrhea, sore throat, cough. He is worried he may have the flu. No fever.     Physical Exam    Vitals:   12/19/23 0554 12/19/23 0718  BP: 106/64   Pulse: 87   Resp: 18   Temp: 97.9 F (36.6 C)   SpO2: 100% 100%    General: Awake, no distress.  CV:  Good peripheral perfusion.  Resp:  Normal effort. Breath sounds clear. Abd:  No distention.  Other:     ED Results / Procedures / Treatments   Labs (all labs ordered are listed, but only abnormal results are displayed)  Labs Reviewed  RESP PANEL BY RT-PCR (RSV, FLU A&B, COVID)  [id number]  GROUP A STREP BY PCR     EKG  Not indicated.   RADIOLOGY  Image and radiology report reviewed and interpreted by me. Radiology report consistent with the same.  Not indicated.  PROCEDURES:  Critical Care performed: No  Procedures   MEDICATIONS ORDERED IN ED:  Medications - No data to display   IMPRESSION / MDM / ASSESSMENT AND PLAN / ED COURSE   I have reviewed the triage note and vital signs. Vital signs stable.   Differential diagnosis includes, but is not limited to, Covid, influenza, viral syndrome  Patient's presentation is most consistent with acute illness / injury with system symptoms.  43 year old male presents with viral symptoms as described in HPI.  Respiratory panel is negative.   Exam and vital signs are reassuring. Symptomatic treatment advised. Outpatient follow up and ER return precautions advised.       FINAL CLINICAL IMPRESSION(S) / ED DIAGNOSES   Final diagnoses:  Acute URI     Rx / DC Orders   ED  Discharge Orders     None        Note:  This document was prepared using Dragon voice recognition software and may include unintentional dictation errors.   Herlinda Kirk NOVAK, FNP 12/20/23 [id number]    Claudene Rover, MD 12/20/23 [phone]

## 2024-02-12 ENCOUNTER — Ambulatory Visit: Admitting: Podiatry

## 2024-02-12 DIAGNOSIS — M21962 Unspecified acquired deformity of left lower leg: Secondary | ICD-10-CM

## 2024-02-12 DIAGNOSIS — L97522 Non-pressure chronic ulcer of other part of left foot with fat layer exposed: Secondary | ICD-10-CM

## 2024-02-12 DIAGNOSIS — M21961 Unspecified acquired deformity of right lower leg: Secondary | ICD-10-CM

## 2024-02-12 NOTE — Progress Notes (Signed)
 "  Subjective:  Patient ID: James [name], male    DOB: 06/25/1980,  MRN: [id number]  Chief Complaint  Patient presents with   Callouses    44 y.o. male presents for wound care.  Patient presents with new ulceration to right submetatarsal 1 he states he works a lot on his foot.  His last A1c was 10.6%.  He states his sugars are better.  Is causing him a lot of discomfort.  He started bleeding.  He does not wear any orthotics.  Denies any other acute complaints pain scale 7 out of 10 dull aching nature.   Review of Systems: Negative except as noted in the HPI. Denies N/V/F/Ch.  Past Medical History:  Diagnosis Date   Diabetes mellitus without complication (HCC)    Current Medications[1]  Tobacco Use History[2]  Allergies[3] Objective:  There were no vitals filed for this visit. There is no height or weight on file to calculate BMI. Constitutional Well developed. Well nourished.  Vascular Dorsalis pedis pulses palpable bilaterally. Posterior tibial pulses palpable bilaterally. Capillary refill normal to all digits.  No cyanosis or clubbing noted. Pedal hair growth normal.  Neurologic Normal speech. Oriented to person, place, and time. Protective sensation absent  Dermatologic Wound Location: Right submetatarsal 1 with fat layer exposed does not probe down to deep tissue or bone.  No malodor present no redness noted. Wound Base: Mixed Granular/Fibrotic Peri-wound: Calloused Exudate: Scant/small amount Serosanguinous exudate Wound Measurements: - See below  Orthopedic: No pain to palpation either foot.   Radiographs: None Assessment:  No diagnosis found. Plan:  Patient was evaluated and treated and all questions answered.  Ulcer right submetatarsal 1 ulceration -Debridement as below. -Dressed with Betadine wet-to-dry dressing, DSD. -Continue off-loading with surgical shoe.  Procedure: Excisional Debridement of Wound Tool: Sharp #312 chisel blade/tissue nipper Type  of Debridement: Sharp Excisional Frequency: @Every  3 weeks until appropriately healed.  Dressing is to be changed daily/keeping the wound clean and dry Rationale: Removal of non-viable soft tissue from the wound to promote healing.  Anesthesia: none Pre-Debridement Wound Measurements: 1 cm x 0.5 cm x 0.3 cm  Post-Debridement Wound Measurements: 1.1 Cm x 0.7 cm x 0.3 cm  Area devitalized tissue removed(nonviable tissue only): 0.5 cm x 0.3 cm.  Blood loss: Minimal (<50cc) Depth of Debridement: with fat layer exposed Description of tissue removed: Devitalized Tissue Technique: The wound and the surrounding skin were prepped and draped in usual aseptic fashion.  Aseptic technique was maintained throughout the procedure.  Using #312 blade/tissue nipper sharp debridement of necrotic/nonviable tissue was performed until healthy bleeding wound bed was achieved.  No underlying bone or tendon was exposed during debridement.  The wound was thoroughly irrigated with normal saline solution Wound Progress:  Current Wound Volume: Debridement was performed of the chronic nonhealing diabetic foot wound on right foot submetatarsal 1.  Debridement removed 0.5 cm x 0.3 cm of the necrotic tissue and subcutaneous tissue and none amount of purulent drainage was present. Presence/absence of tissue: Necrotic tissue/nonviable tissue present at the base of the wound.  Sharp debridement was performed to remove the necrotic tissue/nonviable tissue back to viable tissue.  No devitalized/nonviable tissue present postdebridement.  Wound appeared clean and clear of infection No material in the wound was present that was identified to be inhibiting healing. Dressing: Dry, sterile, compression dressing. Disposition: Patient tolerated procedure well. Patient to return in 1 week for follow-up or as listed above.   Pes planovalgus/foot deformity -I explained to patient the etiology  of pes planovalgus and relationship with heel  pain/arch pain and various treatment options were discussed.  Given patient foot structure in the setting of heel pain/arch pain I believe patient will benefit from custom-made orthotics to help control the hindfoot motion support the arch of the foot and take the stress away from arches.  Patient agrees with the plan like to proceed with orthotics -Patient was casted for orthotics  No follow-ups on file.       [1]  Current Outpatient Medications:    acetaminophen  (TYLENOL ) 325 MG tablet, Take 2 tablets (650 mg total) by mouth every 6 (six) hours as needed for mild pain (or Fever >/= 101)., Disp: , Rfl:    Blood Glucose Monitoring Suppl (ONETOUCH VERIO FLEX SYSTEM) w/Device KIT, SMARTSIG:device 1-3 Times Daily, Disp: , Rfl:    cholecalciferol  (VITAMIN D3) 25 MCG (1000 UNIT) tablet, Take 1,000 Units by mouth daily., Disp: , Rfl:    Continuous Glucose Sensor (DEXCOM G7 SENSOR) MISC, 1 Device by Does not apply route as directed., Disp: 9 each, Rfl: 3   ferrous gluconate  (FERGON) 324 MG tablet, Take 324 mg by mouth. Take 1 tablet by mouth 3 (three) times a week, Disp: , Rfl:    insulin  aspart (NOVOLOG ) 100 UNIT/ML injection, Max daily 30 units  per pump, Disp: 30 mL, Rfl: 3   Insulin  Infusion Pump (T:SLIM X2 CONTROL-IQ 7.8 PUMP) DEVI, USE AS DIRECTED, Disp: 1 each, Rfl: 0   Insulin  Infusion Pump Supplies (T:SLIM X2 CARTRIDGE) MISC, CHANGE EVERY TWO (2) DAYS, Disp: 50 each, Rfl: 2   Insulin  Infusion Pump Supplies (TANDEM T:SLIM TRUSTL PK10 23) MISC, CHANGE EVERY TWO (2) DAYS, Disp: 90 each, Rfl: 3   losartan  (COZAAR ) 25 MG tablet, Take 1 tablet (25 mg total) by mouth daily., Disp: 90 tablet, Rfl: 3   metoCLOPramide  (REGLAN ) 10 MG tablet, Take 1 tablet (10 mg total) by mouth every 8 (eight) hours as needed for nausea or vomiting., Disp: 30 tablet, Rfl: 1   omeprazole (PRILOSEC) 40 MG capsule, 1 capsule. (Patient not taking: Reported on 09/26/2023), Disp: , Rfl:    rosuvastatin  (CRESTOR ) 40 MG  tablet, Take 1 tablet (40 mg total) by mouth daily., Disp: 90 tablet, Rfl: 3   silver  sulfADIAZINE  (SILVADENE ) 1 % cream, Apply 1 Application topically daily., Disp: 50 g, Rfl: 0   tamsulosin (FLOMAX) 0.4 MG CAPS capsule, 1 capsule. (Patient not taking: Reported on 09/26/2023), Disp: , Rfl:  [2]  Social History Tobacco Use  Smoking Status Never  Smokeless Tobacco Never  [3] No Known Allergies  "

## 2024-03-04 ENCOUNTER — Ambulatory Visit: Admitting: Podiatry
